# Patient Record
Sex: Female | Born: 1965 | Race: White | Hispanic: No | Smoking: Never smoker
Health system: Southern US, Community
[De-identification: ages and names within clinical notes are randomized; demographics above are authoritative.]

## PROBLEM LIST (undated history)

## (undated) DIAGNOSIS — G629 Polyneuropathy, unspecified: Secondary | ICD-10-CM

## (undated) DIAGNOSIS — M5126 Other intervertebral disc displacement, lumbar region: Secondary | ICD-10-CM

## (undated) DIAGNOSIS — R011 Cardiac murmur, unspecified: Secondary | ICD-10-CM

## (undated) DIAGNOSIS — M4800 Spinal stenosis, site unspecified: Secondary | ICD-10-CM

## (undated) DIAGNOSIS — D510 Vitamin B12 deficiency anemia due to intrinsic factor deficiency: Secondary | ICD-10-CM

## (undated) DIAGNOSIS — F32A Depression, unspecified: Secondary | ICD-10-CM

## (undated) DIAGNOSIS — G43909 Migraine, unspecified, not intractable, without status migrainosus: Secondary | ICD-10-CM

## (undated) DIAGNOSIS — M659 Unspecified synovitis and tenosynovitis, unspecified site: Secondary | ICD-10-CM

## (undated) DIAGNOSIS — G5600 Carpal tunnel syndrome, unspecified upper limb: Secondary | ICD-10-CM

## (undated) DIAGNOSIS — K219 Gastro-esophageal reflux disease without esophagitis: Secondary | ICD-10-CM

## (undated) DIAGNOSIS — M069 Rheumatoid arthritis, unspecified: Secondary | ICD-10-CM

## (undated) DIAGNOSIS — H469 Unspecified optic neuritis: Secondary | ICD-10-CM

## (undated) DIAGNOSIS — E7800 Pure hypercholesterolemia, unspecified: Secondary | ICD-10-CM

## (undated) DIAGNOSIS — G35D Multiple sclerosis, unspecified: Secondary | ICD-10-CM

## (undated) DIAGNOSIS — M5030 Other cervical disc degeneration, unspecified cervical region: Secondary | ICD-10-CM

## (undated) DIAGNOSIS — M359 Systemic involvement of connective tissue, unspecified: Secondary | ICD-10-CM

## (undated) DIAGNOSIS — F329 Major depressive disorder, single episode, unspecified: Secondary | ICD-10-CM

## (undated) DIAGNOSIS — G35 Multiple sclerosis: Secondary | ICD-10-CM

## (undated) HISTORY — DX: Multiple sclerosis: G35

## (undated) HISTORY — DX: Spinal stenosis, site unspecified: M48.00

## (undated) HISTORY — DX: Cardiac murmur, unspecified: R01.1

## (undated) HISTORY — DX: Migraine, unspecified, not intractable, without status migrainosus: G43.909

## (undated) HISTORY — PX: TONSILLECTOMY: SUR1361

## (undated) HISTORY — DX: Gastro-esophageal reflux disease without esophagitis: K21.9

## (undated) HISTORY — PX: ADENOIDECTOMY: SUR15

## (undated) HISTORY — DX: Depression, unspecified: F32.A

## (undated) HISTORY — DX: Vitamin B12 deficiency anemia due to intrinsic factor deficiency: D51.0

## (undated) HISTORY — DX: Carpal tunnel syndrome, unspecified upper limb: G56.00

## (undated) HISTORY — DX: Other intervertebral disc displacement, lumbar region: M51.26

## (undated) HISTORY — DX: Pure hypercholesterolemia, unspecified: E78.00

## (undated) HISTORY — DX: Other cervical disc degeneration, unspecified cervical region: M50.30

## (undated) HISTORY — DX: Rheumatoid arthritis, unspecified: M06.9

## (undated) HISTORY — DX: Polyneuropathy, unspecified: G62.9

## (undated) HISTORY — DX: Multiple sclerosis, unspecified: G35.D

## (undated) HISTORY — PX: TONSILECTOMY, ADENOIDECTOMY, BILATERAL MYRINGOTOMY AND TUBES: SHX2538

## (undated) HISTORY — DX: Major depressive disorder, single episode, unspecified: F32.9

## (undated) HISTORY — DX: Unspecified synovitis and tenosynovitis, unspecified site: M65.90

## (undated) HISTORY — DX: Unspecified optic neuritis: H46.9

## (undated) HISTORY — DX: Synovitis and tenosynovitis, unspecified: M65.9

## (undated) HISTORY — PX: NASAL FRACTURE SURGERY: SHX718

---
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
## 1987-03-01 HISTORY — PX: CHOLECYSTECTOMY: SHX55

## 1998-01-19 ENCOUNTER — Other Ambulatory Visit: Admission: RE | Admit: 1998-01-19 | Discharge: 1998-01-19 | Payer: Self-pay | Admitting: Obstetrics & Gynecology

## 1999-01-19 ENCOUNTER — Other Ambulatory Visit: Admission: RE | Admit: 1999-01-19 | Discharge: 1999-01-19 | Payer: Self-pay | Admitting: Obstetrics & Gynecology

## 2000-01-09 ENCOUNTER — Ambulatory Visit (HOSPITAL_COMMUNITY): Admission: RE | Admit: 2000-01-09 | Discharge: 2000-01-09 | Payer: Self-pay | Admitting: Neurosurgery

## 2000-01-09 ENCOUNTER — Encounter: Payer: Self-pay | Admitting: Neurosurgery

## 2000-02-01 ENCOUNTER — Other Ambulatory Visit: Admission: RE | Admit: 2000-02-01 | Discharge: 2000-02-01 | Payer: Self-pay | Admitting: Obstetrics & Gynecology

## 2000-03-17 ENCOUNTER — Encounter: Payer: Self-pay | Admitting: Neurosurgery

## 2000-03-17 ENCOUNTER — Ambulatory Visit (HOSPITAL_COMMUNITY): Admission: RE | Admit: 2000-03-17 | Discharge: 2000-03-17 | Payer: Self-pay | Admitting: Neurosurgery

## 2000-03-31 ENCOUNTER — Encounter: Payer: Self-pay | Admitting: Neurosurgery

## 2000-03-31 ENCOUNTER — Ambulatory Visit (HOSPITAL_COMMUNITY): Admission: RE | Admit: 2000-03-31 | Discharge: 2000-03-31 | Payer: Self-pay | Admitting: Neurosurgery

## 2000-04-14 ENCOUNTER — Ambulatory Visit (HOSPITAL_COMMUNITY): Admission: RE | Admit: 2000-04-14 | Discharge: 2000-04-14 | Payer: Self-pay | Admitting: Neurosurgery

## 2000-04-14 ENCOUNTER — Encounter: Payer: Self-pay | Admitting: Neurosurgery

## 2000-06-05 ENCOUNTER — Ambulatory Visit (HOSPITAL_COMMUNITY): Admission: RE | Admit: 2000-06-05 | Discharge: 2000-06-05 | Payer: Self-pay | Admitting: Obstetrics & Gynecology

## 2000-06-05 ENCOUNTER — Encounter: Payer: Self-pay | Admitting: Obstetrics & Gynecology

## 2000-06-29 ENCOUNTER — Encounter (HOSPITAL_COMMUNITY): Admission: RE | Admit: 2000-06-29 | Discharge: 2000-07-29 | Payer: Self-pay | Admitting: Rheumatology

## 2000-10-14 ENCOUNTER — Encounter: Payer: Self-pay | Admitting: Neurosurgery

## 2000-10-14 ENCOUNTER — Ambulatory Visit (HOSPITAL_COMMUNITY): Admission: RE | Admit: 2000-10-14 | Discharge: 2000-10-14 | Payer: Self-pay | Admitting: Neurosurgery

## 2001-09-10 ENCOUNTER — Other Ambulatory Visit: Admission: RE | Admit: 2001-09-10 | Discharge: 2001-09-10 | Payer: Self-pay | Admitting: Obstetrics & Gynecology

## 2002-01-11 ENCOUNTER — Ambulatory Visit (HOSPITAL_COMMUNITY): Admission: RE | Admit: 2002-01-11 | Discharge: 2002-01-11 | Payer: Self-pay | Admitting: Orthopedic Surgery

## 2002-01-11 ENCOUNTER — Encounter: Payer: Self-pay | Admitting: Orthopedic Surgery

## 2002-05-20 ENCOUNTER — Encounter: Payer: Self-pay | Admitting: Rheumatology

## 2002-05-20 ENCOUNTER — Ambulatory Visit (HOSPITAL_COMMUNITY): Admission: RE | Admit: 2002-05-20 | Discharge: 2002-05-20 | Payer: Self-pay | Admitting: Rheumatology

## 2002-08-06 ENCOUNTER — Ambulatory Visit (HOSPITAL_BASED_OUTPATIENT_CLINIC_OR_DEPARTMENT_OTHER): Admission: RE | Admit: 2002-08-06 | Discharge: 2002-08-06 | Payer: Self-pay | Admitting: Orthopedic Surgery

## 2002-08-06 ENCOUNTER — Encounter (INDEPENDENT_AMBULATORY_CARE_PROVIDER_SITE_OTHER): Payer: Self-pay | Admitting: *Deleted

## 2002-10-11 ENCOUNTER — Ambulatory Visit (HOSPITAL_COMMUNITY): Admission: RE | Admit: 2002-10-11 | Discharge: 2002-10-11 | Payer: Self-pay | Admitting: Family Medicine

## 2002-10-11 ENCOUNTER — Encounter: Payer: Self-pay | Admitting: Family Medicine

## 2002-10-18 ENCOUNTER — Ambulatory Visit (HOSPITAL_COMMUNITY): Admission: RE | Admit: 2002-10-18 | Discharge: 2002-10-18 | Payer: Self-pay | Admitting: Neurology

## 2003-01-08 ENCOUNTER — Other Ambulatory Visit: Admission: RE | Admit: 2003-01-08 | Discharge: 2003-01-08 | Payer: Self-pay | Admitting: Obstetrics & Gynecology

## 2003-06-02 ENCOUNTER — Ambulatory Visit (HOSPITAL_COMMUNITY): Admission: RE | Admit: 2003-06-02 | Discharge: 2003-06-02 | Payer: Self-pay | Admitting: *Deleted

## 2003-10-31 ENCOUNTER — Ambulatory Visit (HOSPITAL_COMMUNITY): Admission: RE | Admit: 2003-10-31 | Discharge: 2003-10-31 | Payer: Self-pay | Admitting: Otolaryngology

## 2004-03-10 ENCOUNTER — Other Ambulatory Visit: Admission: RE | Admit: 2004-03-10 | Discharge: 2004-03-10 | Payer: Self-pay | Admitting: Obstetrics & Gynecology

## 2004-03-17 ENCOUNTER — Inpatient Hospital Stay (HOSPITAL_COMMUNITY): Admission: AD | Admit: 2004-03-17 | Discharge: 2004-03-17 | Payer: Self-pay | Admitting: Obstetrics and Gynecology

## 2004-05-10 ENCOUNTER — Ambulatory Visit (HOSPITAL_COMMUNITY): Admission: RE | Admit: 2004-05-10 | Discharge: 2004-05-10 | Payer: Self-pay | Admitting: Psychiatry

## 2004-05-17 ENCOUNTER — Ambulatory Visit (HOSPITAL_COMMUNITY): Admission: RE | Admit: 2004-05-17 | Discharge: 2004-05-17 | Payer: Self-pay | Admitting: Obstetrics and Gynecology

## 2004-06-07 ENCOUNTER — Inpatient Hospital Stay (HOSPITAL_COMMUNITY): Admission: RE | Admit: 2004-06-07 | Discharge: 2004-06-09 | Payer: Self-pay | Admitting: Obstetrics and Gynecology

## 2004-06-07 HISTORY — PX: TOTAL ABDOMINAL HYSTERECTOMY: SHX209

## 2004-06-07 HISTORY — PX: APPENDECTOMY: SHX54

## 2005-01-25 ENCOUNTER — Ambulatory Visit: Payer: Self-pay | Admitting: Cardiology

## 2005-01-25 ENCOUNTER — Ambulatory Visit (HOSPITAL_COMMUNITY): Admission: RE | Admit: 2005-01-25 | Discharge: 2005-01-25 | Payer: Self-pay | Admitting: *Deleted

## 2006-06-13 ENCOUNTER — Ambulatory Visit (HOSPITAL_COMMUNITY): Admission: RE | Admit: 2006-06-13 | Discharge: 2006-06-13 | Payer: Self-pay | Admitting: *Deleted

## 2006-06-13 HISTORY — PX: CARDIAC CATHETERIZATION: SHX172

## 2006-08-03 ENCOUNTER — Ambulatory Visit (HOSPITAL_COMMUNITY): Admission: RE | Admit: 2006-08-03 | Discharge: 2006-08-03 | Payer: Self-pay | Admitting: Psychiatry

## 2006-11-03 ENCOUNTER — Ambulatory Visit (HOSPITAL_COMMUNITY): Admission: RE | Admit: 2006-11-03 | Discharge: 2006-11-03 | Payer: Self-pay | Admitting: Orthopedic Surgery

## 2007-02-26 ENCOUNTER — Ambulatory Visit (HOSPITAL_COMMUNITY): Admission: RE | Admit: 2007-02-26 | Discharge: 2007-02-26 | Payer: Self-pay | Admitting: Neurology

## 2007-02-26 ENCOUNTER — Encounter: Payer: Self-pay | Admitting: Neurology

## 2009-02-26 ENCOUNTER — Ambulatory Visit (HOSPITAL_COMMUNITY): Admission: RE | Admit: 2009-02-26 | Discharge: 2009-02-26 | Payer: Self-pay | Admitting: Family Medicine

## 2009-05-25 ENCOUNTER — Encounter: Admission: RE | Admit: 2009-05-25 | Discharge: 2009-05-25 | Payer: Self-pay | Admitting: Neurosurgery

## 2009-12-02 ENCOUNTER — Ambulatory Visit (HOSPITAL_COMMUNITY): Admission: RE | Admit: 2009-12-02 | Discharge: 2009-12-02 | Payer: Self-pay | Admitting: Family Medicine

## 2010-03-21 ENCOUNTER — Encounter: Payer: Self-pay | Admitting: Family Medicine

## 2010-07-16 NOTE — Op Note (Signed)
NAME:  WANNA, Nancy Byrd                          ACCOUNT NO.:  [id number]   MEDICAL RECORD NO.:  [id number]                   PATIENT TYPE:  OUT   LOCATION:  RAD                                  FACILITY:  [facility]   PHYSICIAN:  Katy Fitch. Naaman Plummer., M.D.          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   OPERATION PERFORMED:  1. Radical tenosynovectomy of right index flexor digitorum profundus and     superficialis tendons with central resection of swollen segment of flexor     digitorum profundus tendon causing triggering at A2 pulley and partial     release of A1 pulley to facilitate proximal tenosynovectomy.  2. Radical tenosynovectomy of right long finger flexor digitorum     superficialis and profundus tendons from A5 to A1 pulleys with     preservation of annular pulleys, followed by partial resection of flexor     digitorum profundus due to severe swelling due to nodular tenosynovitis.  3. Right carpal tunnel release and tenosynovectomy of ulnar bursa.   SURGEON:  Katy Fitch. Sypher, M.D.   ASSISTANT:  Jonni Sanger, P.A.   ANESTHESIA:  Axillary block.   SUPERVISING ANESTHESIOLOGIST:  Dr. Katrinka Blazing.   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  She has been managed by Dr. Kellie Simmering and Dr. Corliss Skains from a rheumatologic  standpoint with medical management of her illness.  She was referred  approximately one year ago for evaluation and management of nodular flexor  tenosynovitis affecting her hands bilaterally.  She had developed severe  pain and inability to flex her index and long fingers.   In the fall of 2003 she was referred for an MRI of her right and left hands  in an effort to determine whether or not she had tendon ruptures.  She was  noted to have severe infiltrative nodular tenosynovitis involving profundus  and superficialis tendons, primarily the index and long fingers as well as  some involvement in the carpal canal.  She had numbness in her right and  left hand consistent with carpal tunnel syndrome.  She was referred for  aggressive medical management of her disease and after working with  her  rheumatologist, utilizing a combination of methotrexate and Enbrel, she has  had some improvement in her tenosynovitis.  Due to failure to regain active  flexion of her index and long fingers, however, as well as persistence of  her episodic numbness, she is brought to the operating room at this time  anticipating radical tenosynovectomy and carpal tunnel release.   After informed consent she is brought to the operating room at this time.   DESCRIPTION OF PROCEDURE:  Nancy Byrd was brought to the operating room  and placed in supine position on the operating table.  The right arm was  prepped with Hibiclens and alcohol and draped with stockinet and impervious  arthroscopy drapes.  Following exsanguination of the right arm with an  Esmarch bandage, an arterial tourniquet on the proximal brachium was  inflated to .   The procedure commenced with planning of Brunner's zigzag incisions exposing  the flexor sheaths from A5 to A1 for the index and long finger.  The carpal  tunnel  incision was planned to facilitate release of the transverse carpal  ligament and tenosynovectomy of the flexors in the ulnar bursa.  The skin  incisions were taken sharply and subcutaneous veins electrocauterized.  There was noted to be nodular tenosynovitis extruding from the flexor  sheaths of the index and long fingers at the level of the A3 pulley, C1 and  C2 pulleys and proximal at the A1 pulleys.  Meticulous excision of the  cruciate pulleys was accomplished with preservation of the annular pulleys  of the index and long fingers.  Flexor digitorum profundus and superficialis  tendons were delivered and meticulously stripped of all tenosynovium.  Areas  of nodular infiltration were removed with rongeurs and in both fingers, the  profundus tendon was thinned by a central resection of a fusiform portion of  the involved tendon.  Care was taken to carefully examine the dorsal surface  of the flexor retinaculum and canal particularly deep to the A2 pulleys in  both fingers.  After completion of the tenosynovectomy there was still  triggering noted in the index finger at the level of the A1 pulley.  After  thorough debridement of all tenosynovium with persistent triggering, a  portion of the A1 pulley was released to allow smooth passage of the  tendons.   The carpal tunnel was then exposed through a longitudinal incision in line  of the long-ring interspace.  Subcutaneous tissues were carefully divided  revealing the palmar fascia.  This was split longitudinally to reveal the  contents of the carpal canal.  The superficialis and profundus tendons were  invested in a thickened tenosynovium. This was removed with scissors  dissection.   Traction was applied to the index and long finger profundus and  superficialis tendons to assure full passive motion through the flexor  sheath.  There was no impairment of motion of the long finger.  A very slight residual triggering of the index finger  persisted, therefore  the  profundus tendon was thinned deep to the A2 pulley relieving the triggering.  All wounds were inspected for bleeding points which were electrocauterized  with bipolar current followed by repair of the skin wounds with corner  sutures of 5-0 nylon and intradermal 3-0 Prolene.  There were no apparent  complications.   Nancy Byrd was awakened from sedation and transferred to recovery room with  stable vital signs.  She was placed in a voluminous hand dressing was dorsal  and palmar plaster sandwich  splints maintaining the hand in the SAFE  position.  She will return to our office in follow-up in six days to begin  an active range of motion exercise program.  For aftercare she was given prescriptions for Percocet 5 mg one or two  tablets by mouth every four to six hours as needed for pain, 30 tablets  without refill. Also Levaquin 500 mg one by mouth daily for five days as a  prophylactic antibiotic.                                                Katy Fitch Naaman Plummer., M.D.    RVS/MEDQ  D:  08/06/2002  T:  08/06/2002  Job:  [id number]   cc:   Pollyann Savoy, M.D.  [address]  [place], [place] [address]  Fax: [phone]   Aundra Dubin, M.D.

## 2010-07-16 NOTE — Op Note (Signed)
NAME:  Nancy Byrd, Nancy Byrd NO.:  [id number]   MEDICAL RECORD NO.:  [id number]          PATIENT TYPE:  AMB   LOCATION:  DAY                           FACILITY:  [facility]   PHYSICIAN:  Tilda Burrow, M.D. DATE OF BIRTH:  06/09/1965   DATE OF PROCEDURE:  06/07/2004  DATE OF DISCHARGE:                                 OPERATIVE REPORT   PREOPERATIVE DIAGNOSIS:  Cystic left ovarian mass.   POSTOPERATIVE DIAGNOSIS:  Cystic left ovarian mass; endometrioma, left  ovary;  pelvic endometriosis.   PROCEDURE:  Total abdominal hysterectomy with bilateral salpingo-  oophorectomy. Peritoneal biopsies of endometriosis. Appendectomy.  Panniculectomy.   SURGEON:  Dr. Emelda Fear. , Assistant: Paulino Rily.   ANESTHESIA:  General.   SPECIMENS:  Uterus, tubes, and ovaries, appendix and ellipse of skin and  underlying fatty tissue.   ESTIMATED BLOOD LOSS:  200 cc.   COMPLICATIONS:  None.   FINDINGS:  Lax pelvic support structures, endometriomas in both ovaries,  left distinctly larger than right, pelvis adhesions with endometrial  implants on the anterior bladder and posterior uterosacral ligaments. Normal  appearing bowel, small, omentum with no abnormalities noted.   DETAILS OF PROCEDURE:  The patient was taken to the operating room and  prepped and draped for vertical lower abdominal incision. Skin was entered  in the midline from the umbilicus to symphysis pubis with excision of an 8-  cm maximum width x 20 cm maximum length ellipse of skin and underlying fatty  tissue as per patient's specific request, having been marked preoperatively  and at preoperative procedures. Preoperative consultation. Peritoneal cavity  was entered carefully in the midline and pelvis inspected only. The  endometrial implants and endometriosis identified. The bowel was packed away  with Balfour retractor in place. Care was made to ensure that the blades do  not reach the pelvic vessels and no  retroperitoneal pressure was applied by  the blade tips. Attention was directed to the uterus which was grasped with  Lahey thyroid tenaculum and elevated. Pelvic washings were obtained. The  left ovary was found to have thin filmy adhesions from its posterior aspects  to the lateral and inferior pelvic side wall. This could be sharply  dissected free, leaving the ovary intact as well as the bowel and  retroperitoneum. Round ligaments were taken down, doubly ligated, clamped,  cut, and suture ligated, and bladder flap developed below the anterior  endometrial implants. Four small implants were excised off the surface of  the bladder. The bladder was taken down, and the peritoneum opened lateral  to the infundibulopelvic ligament adequately to identify the ureters on  either side. Infundibulopelvic ligaments were then clamped, cut, and suture  ligated. Broad ligament was skeletonized down to the uterine vessels. Curved  Heaney clamp was used to cross the uterine vessels with a Kelly clamp for  back bleeding. Then the uterine vessels were transected and ligated with 0  chromic, and open and lower cardinal ligaments serially taken down with  straight Heaney clamp, knife dissection, and 0 chromic suture ligature. We  march down each side of the  uterus until reaching the level of the cervix  where upon a stab incision could be made in the anterior cervical vaginal  fornix and the cervix amputated, cervix and uterine all removed in one  specimen. The cuff was closed in the midline, after Aldrich stitches were  placed in each lateral vaginal angle to adhere the cuff to the upper and  lower cardinal ligaments. The cuff was easily reapproximated with good  hemostasis. Uterosacral ligament support of the cuff was considered  satisfactory. A small triangle at the anterior vaginal cuff had been excised  to allow for optimal tissue edge reapproximation and to reduce the potential  for subsequent  cystocele.   Pelvic floor was irrigated, inspected, and confirmed and hemostatic.  Interrupted 2-0 chromic was used to reperitonealize over the cuff.   The Balfour retractor was removed. Laparotomy tapes were removed, and the  appendix inspected. It was easily identifiable anterior and was removed as  per preoperative surgical plan. The mesoappendix was separated into two  pedicles with Kelly clamps across them, then the appendiceal stump doubly  clamped with Kelly clamps, transected, and the appendiceal stump doubly  ligated with 2-0 chromic. Pelvis was again irrigated. Inspection of the  visible surface of the bowel showed no evidence of bleeding or any suspicion  of contamination. Anterior peritoneum was closed with 2-0 chromic.   Sponge and needle counts were correct.   Fascia was trimmed in the upper one half of the midline incision to improve  lower abdominal tone and pull together with continuous running 0 Maxon  suture with excellent tissue edge approximation. Interrupted 2-0 plain  sutures were used to pull the subcu fatty tissue together in the midline,  and staple closure of the skin completed the procedure. A flat JP drain in  the subcu space was allowed to exit through the inferior aspect of the  incision. Sponge and needle counts were again correct. The patient went to  recovery room in good condition. EBL 200 cc.      JVF/MEDQ  D:  06/07/2004  T:  06/07/2004  Job:  [id number]

## 2010-07-16 NOTE — Cardiovascular Report (Signed)
[name], Nancy Byrd NO.:  [id number]   MEDICAL RECORD NO.:  [id number]          PATIENT TYPE:  OIB   LOCATION:  2855                         FACILITY:  [facility]   PHYSICIAN:  Nanetta Batty, M.D.   DATE OF BIRTH:  02/06/1966   DATE OF PROCEDURE:  DATE OF DISCHARGE:                            CARDIAC CATHETERIZATION   PROCEDURES:  1. Left heart catheterization.  2. Coronary angiography.  3. Left ventriculogram.  4. Abdominal arteriogram.   ATTENDING PHYSICIAN:  Darlin Priestly, MD   COMPLICATIONS:  None.   INDICATIONS:  Ms. Savarino is a 45 year old female patient of Dr. Donzetta Sprung with a history of multiple sclerosis, pernicious anemia, reflux  disease, rheumatoid arthritis who recently complained of a rest and  exertional chest pain with sensation of crushing type of pressure and  some facial edema.  She was ultimately evaluated in the emergency room  and she was felt to have a normal EKG.  She is now referred for cardiac  catheterization without significant CAD.   After obtaining informed consent, patient was brought to the cardiac  cath lab.  Shaved, prepped and draped in the usual sterile fashion.  __________  modified Seldinger technique, #6 French arterial sheath in  the right femoral artery.  A 6 French diagnostic catheter was performed  and diagnostic angiography.   Left main is a large vessel showed no evidence of disease.   The LAD to large vessel __________  2 diagonal branches to the LAD has  no significant disease.   The first diagonal to the small vessel has no significant disease.   The second diagonal to the small vessel has significant disease.   Left circumflex to the large vessel which was dominant, gives rise to  two obtuse marginal branches as well as PD and post lateral branch.  AVB  circumflex has no significant disease.   The first OM is a large vessel which bifurcates in the mid segment with  no significant disease.   The second OM is a medium size vessel with no significant disease.   The PDA and  posterolateral branches are large vessels which originate  from the circumflex has no significant disease.   The right coronary artery is a small, nondominant vessel with  significant disease.   Left ventriculogram reveals an EF of 60%.   Abdominal aortogram has no evidence of coronary artery stenosis.   Hemodynamic system:  Arterial pressure of 140/91, LV systolic pressure  144/46. LVDEP Of 13.   CONCLUSION:  1. No significant coronary artery disease.  2. No evidence of systolic dysfunction.  3. No evidence renal artery stenosis.  4. Systemic hypertension.      Nanetta Batty, M.D.  Electronically Signed     JB/MEDQ  D:  06/13/2006  T:  06/13/2006  Job:  [id number]   cc:   Donzetta Sprung

## 2010-07-16 NOTE — Procedures (Signed)
Nancy Byrd, BROOKSHIRE NO.:  [id number]   MEDICAL RECORD NO.:  [id number]          PATIENT TYPE:  OUT   LOCATION:  RAD                           FACILITY:  [facility]   PHYSICIAN:  [place] Bing, M.D. [facility] OF BIRTH:  13-Nov-1965   DATE OF PROCEDURE:  01/25/2005  DATE OF DISCHARGE:                                  ECHOCARDIOGRAM   REFERRING:  Dr. Garner Nash and Dr. Dorethea Clan.   CLINICAL DATA:  A 45 year old woman with murmur.   M-MODE:  Aorta 2.5, left atrium 4.0, septum 1.1, posterior wall 1.1, LV  diastole 4.8, LV systole 3.2.   1.  Technically adequate echocardiographic study.  2.  Mild left atrial enlargement; normal right atrium and right ventricle.  3.  Normal trileaflet aortic valve.  4.  Delicate mitral valve; flat coaptation but no definite prolapse.  5.  Normal tricuspid and pulmonic valve; normal proximal pulmonary artery.      Trivial tricuspid regurgitation; normal estimated RV systolic pressure.  6.  Normal internal dimension, wall thickness, regional and global function      of the left ventricle.  7.  Normal IVC.  8.  Comparison with prior study of June 02, 2003:  No significant interval      change.      Matthews Bing, M.D. [facility]  Electronically Signed     RR/MEDQ  D:  01/25/2005  T:  01/26/2005  Job:  [id number]

## 2010-07-16 NOTE — Procedures (Signed)
NAME:  MYKIA, HOLTON NO.:  [id number]   MEDICAL RECORD NO.:  [id number]                  PATIENT TYPE:  PREC   LOCATION:                                       FACILITY:   PHYSICIAN:  Vida Roller, M.D.                DATE OF BIRTH:  [birth year]   DATE OF PROCEDURE:  06/02/2003  DATE OF DISCHARGE:                                    STRESS TEST   PROCEDURE:  Adenosine Cardiolite   INDICATIONS:  This patient is a 45 year old female with no known coronary  artery disease and atypical chest discomfort.  She has a recent diagnosis of  multiple sclerosis and is being treated with __________ which is an  interferon.  Since she has started taking this medication she has had this  chest discomfort.   CARDIAC RISK FACTORS:  No diabetes, no family history, no hyperlipidemia and  no tobacco abuse.   BASELINE DATA:  EKG reveals a sinus bradycardia at 56 beats/minute with  nonspecific ST abnormalities.  Blood pressure is 128/80.   STRESS DATA:  45 mg of adenosine was infused over a 4 minute protocol with  Cardiolite injected at 3 minutes.  The patient reported chest pressure which  resolved in recovery.  EKG reveals no arrhythmias and no ischemic changes.   Final images and results are pending MD review.     ________________________________________  ___________________________________________  Jae Dire, P.A. [facility]                      Vida Roller, M.D.   AB/MEDQ  D:  06/02/2003  T:  06/02/2003  Job:  [id number]

## 2010-07-16 NOTE — Op Note (Signed)
   NAME:  Nancy Byrd, Nancy Byrd NO.:  [id number]   MEDICAL RECORD NO.:  [id number]                   PATIENT TYPE:  OUT   LOCATION:  MDC                                  FACILITY:  [facility]   PHYSICIAN:  Marlan Palau, M.D.               DATE OF BIRTH:  1965/07/17   DATE OF PROCEDURE:  10/18/2002  DATE OF DISCHARGE:                                 OPERATIVE REPORT   PROCEDURE:  Lumbar puncture.   HISTORY:  This is a 45 year old patient who is reporting sensory alterations  in the left face, left arm, has documented white matter lesion in the left  parietal area.  This patient is being evaluated for possible demyelinating  disease.   A lumbar puncture was performed today with the patient in the fetal position  on the right side.  The low back was cleaned with alcohol due to  BETADINE/IODINE allergy.  The patient was given 2 mL of 1% Xylocaine as a  local anesthetic.  A 20-gauge spinal needle was inserted into the L3-4  interspace and approximately 18 mL of clear, colorless spinal fluid was  removed for testing.  Opening pressure was 160 mmH2O.  Tube #1 was sent for  VDRL, cryptococcal antigen, angiotensin-converting enzyme level.  Tube #2  was sent for oligoclonal banding, IgG/albumin ratio.  Tube #3 was sent for  cells, differential, protein, glucose.  Tube #4 was sent for Lyme antibody  panel.  Blood work was obtained for ANA, sedimentation rate, homocysteine  level, antiphospholipid antibody panel.  The patient tolerated the procedure  well.  No complications of the above procedure were noted.  The patient  white female with [facility] for results in one week.                                               Marlan Palau, M.D.    CKW/MEDQ  D:  10/18/2002  T:  10/19/2002  Job:  [id number]

## 2010-07-16 NOTE — H&P (Signed)
NAME:  Nancy Byrd, Nancy Byrd NO.:  [id number]   MEDICAL RECORD NO.:  [id number]          PATIENT TYPE:  AMB   LOCATION:  DAY                           FACILITY:  [facility]   PHYSICIAN:  Tilda Burrow, M.D. DATE OF BIRTH:  December 24, 1965   DATE OF ADMISSION:  06/07/2004  DATE OF DISCHARGE:  LH                                HISTORY & PHYSICAL   ADMISSION DIAGNOSIS:  Cystic left ovarian mass.   HISTORY OF PRESENT ILLNESS:  This 45 year old female is admitted for surgery  for surgical excision of a homogeneous cystic 7 cm mass in the left ovary,  which is to be removed by exploratory laparotomy.  Plans are for  hysterectomy with removal of uterus, tube and ovary with frozen section to  be performed.  Plans are for removal of the opposite ovary at the same time.  The case has been reviewed with De Blanch, M.D., GYN oncologist,  who supports proceeding with surgical removal here.  The ultrasound showed a  simple cystic-appearing ovary.  She has been followed by W. Varney Baas,  M.D., but requests surgery at [facility] for family and personal  and familiarity with [facility].  She is an employee at [employer].  She is aware of the likely benign nature of the mass, but is  aware that the possibility of malignancy cannot be absolutely ruled out  until tissue samples are fully analyzed.  Plans are to proceed with  laparotomy through a midline incision, frozen section at the time of surgery  and surgical procedure as indicated by moving forward.   PAST MEDICAL HISTORY:  Positive for multiple sclerosis.   PAST SURGICAL HISTORY:  Positive for:  1.  Tonsillectomy in 1970.  2.  Cholecystectomy in 1989.  3.  Rheumatoid arthritis surgery in 2004 at [facility].  4.  Left hand rheumatoid arthritis surgery in 1985.   ALLERGIES:  1.  AMOXICILLIN.  2.  IODINE.   MEDICATIONS:  1.  Rebif 44 mcg subcutaneously on Monday, Wednesday and  Friday.  2.  Topamax 100 mg p.o. b.i.d.  3.  Ambien 100 mg q.h.s.  4.  Neurontin 300 mg t.i.d.  5.  Prevacid 30 mg p.o. daily.  6.  Calcium plus vitamin D 1200 mg daily.  7.  Zomig 5 mg p.r.n.  8.  Oxybutynin chloride 5 mg b.i.d.  9.  Provigil 200 mg t.i.d.  10. Motrin 800 mg.  11. Tylenol 1000 mg taken prior to Rebif.   PHYSICAL EXAMINATION:  GENERAL APPEARANCE:  A slim Caucasian female.  HEIGHT:  5 feet 5 inches.  WEIGHT:  175 pounds.  HEENT:  Pupils equal, round and reactive.  NECK:  Supple.  Trachea.  CHEST:  Clear to auscultation.  ABDOMEN:  Nontender.  Slight lower abdominal laxity.  The midline incision  will involve excision of some redundant skin in the lower abdomen.  PELVIC:  External genitalia normal female.  Vaginal exam with normal cervix.  Normal Pap smear in the last year through Dr. Lacretia Nicks. [facility] office.  Uterus small,  mobile and nontender.  Left adnexal mass palpable behind the  uterus.  Ultrasound confirms absences of ascites.  CT scan shows no intra-  abdominal nodularity, ascites or suspicion of tumor.   PLAN:  Laparotomy, total abdominal hysterectomy and bilateral salpingo-  oophorectomy on June 07, 2004.      JVF/MEDQ  D:  06/03/2004  T:  06/03/2004  Job:  [id number]   cc:   [facility]  Fax: [phone]

## 2010-07-16 NOTE — Procedures (Signed)
NAME:  Nancy Byrd, Nancy Byrd                          ACCOUNT NO.:  [id number]   MEDICAL RECORD NO.:  [id number]                   PATIENT TYPE:  OUT   LOCATION:  RAD                                  FACILITY:  [facility]   PHYSICIAN:  Vida Roller, M.D.                DATE OF BIRTH:  [birth year]   DATE OF PROCEDURE:  06/02/2003  DATE OF DISCHARGE:                                  ECHOCARDIOGRAM   TAPE NUMBER:  LB - [id number].   TAPE COUNT:  [id number] - 3075.   INDICATIONS FOR PROCEDURE:  This is a 45 year old woman for assessment for a  patent foramen ovale.   TECHNICAL QUALITY:  Good.   M-MODE TRACINGS:  The aorta is 27 mm.   The left atrium is 32 mm.   The septum is 10 mm.   The posterior wall is 10 mm.   Left ventricular diastolic dimension is 44 mm.   Left ventricular systolic dimension is 31 mm.   2-D AND DOPPLER IMAGING:  The left ventricle is normal size with normal  systolic function. There are no wall motion abnormalities seen. Diastolic  function is normal as per the transmitral Doppler and pulmonary venous  Doppler tracings. There are no wall motion abnormalities seen. The estimated  ejection fraction is 55% to 60%.   The right ventricle is normal size with normal systolic function.   Both atria appear to be normal size. There is no atrial septal defect, both  by color flow and by agitated saline contrast.   The aortic valve is trileaflet, tri-commissural, with no evidence of  stenosis or regurgitation.   The mitral valve is morphologically unremarkable with trace insufficiency.  No stenosis is seen.   The tricuspid valve is morphologically unremarkable with no insufficiency or  stenosis.   The pulmonic valve is not well seen but appears to have trivial  insufficiency.   The inferior vena cava is normal size.   The pericardial structures are normal.   The ascending aorta is not well seen.      ___________________________________________                 Vida Roller, M.D.   JH/MEDQ  D:  06/02/2003  T:  06/03/2003  Job:  [id number]

## 2010-07-16 NOTE — Discharge Summary (Signed)
NAME:  Nancy Byrd, Nancy Byrd NO.:  [id number]   MEDICAL RECORD NO.:  [id number]          PATIENT TYPE:  INP   LOCATION:  A401                          FACILITY:  [facility]   PHYSICIAN:  Tilda Burrow, M.D. DATE OF BIRTH:  1965/12/15   DATE OF ADMISSION:  06/07/2004  DATE OF DISCHARGE:  LH                                 DISCHARGE SUMMARY   ADMITTING DIAGNOSIS:  Cystic left ovarian mass.   POSTOPERATIVE DIAGNOSES:  Left ovarian endometrioma, extensive pelvic  endometriosis with adhesions.   PROCEDURE:  Laparotomy total abdominal hysterectomy, bilateral salpingo-  oophorectomy, partial panniculectomy and incidental appendectomy April 09, 2004.   HOSPITAL SUMMARY:  This 45 year old female was admitted for exploratory  laparotomy due to homogeneous cystic left mass with plans for removal of  both tubes and ovaries.   PAST MEDICAL HISTORY:  Positive for multiple sclerosis.   HOSPITAL COURSE:  The patient was admitted, underwent exploratory laparotomy  through midline vertical incision with excision of some the adjacent skin  and subcutaneous fatty tissues. She had extensive endometriosis identified.  There were multiple endometrial implants on the bladder flap as well as a  cul-de-sac, which required individual resection as well as the removal of  the uterus, tubes and ovaries bilaterally. Appendectomy was similarly  performed without difficulty.   Postoperative course was notable for temperature to 101 on post op day 1  with elevated white count. Hemoglobin was 13.3, hematocrit 39 on admission.  She had post op hemoglobin 10.8, hematocrit 31.2, white count 12,900. She  was placed on IV Levaquin and defervesced overnight. It is notable that her  activity level was quite low the first 24 hours. This is consistent with  multiple sclerosis patients. The second day, she felt dramatically improved  and was stable for discharge home with active bowels and clean incision.  The  wound culture and gram stained from the flat Jackson-Pratt drain was  negative for organisms. She will be discharged on the following medications:   1.  Levaquin 500 mg p.o. q.d. x5 days.  2.  Vicodin 5/500 30 tablets 1 to 2 q. 4 hours p.r.n. pain.  3.  Estratest 1 p.o. q. day.   Additional medications previously taken are as follows:  1.  Rebif 44 mcg subcu Monday, Wednesday, Friday.  2.  Topamax 100 mg p.o. b.i.d.  3.  Ambien 100 mg q.h.s.  4.  Neurontin 300 mg t.i.d.  5.  Prevacid 30 mg p.o. daily.  6.  Calcium plus vitamin E 1200 mg daily.  7.  Zomig 5 mg p.r.n.  8.  Oxybutynin and chloride 5 mg b.i.d.  9.  Provigil 200 mg t.i.d.  10. Motrin 800 mg before Rebif therapy.  11. Tylenol 1000 prior to Rebif therapy.   Followup will be in 5 days for incision check.      JVF/MEDQ  D:  06/09/2004  T:  06/09/2004  Job:  [id number]

## 2011-10-26 ENCOUNTER — Other Ambulatory Visit (HOSPITAL_COMMUNITY): Payer: Self-pay | Admitting: Family Medicine

## 2011-10-26 DIAGNOSIS — Z139 Encounter for screening, unspecified: Secondary | ICD-10-CM

## 2011-11-01 ENCOUNTER — Ambulatory Visit (HOSPITAL_COMMUNITY)
Admission: RE | Admit: 2011-11-01 | Discharge: 2011-11-01 | Disposition: A | Discharge status: Home / Self Care | Payer: Medicare Other | Source: Ambulatory Visit | Attending: Family Medicine | Admitting: Family Medicine

## 2011-11-01 DIAGNOSIS — Z1231 Encounter for screening mammogram for malignant neoplasm of breast: Secondary | ICD-10-CM | POA: Insufficient documentation

## 2011-11-01 DIAGNOSIS — Z139 Encounter for screening, unspecified: Secondary | ICD-10-CM

## 2011-12-01 ENCOUNTER — Other Ambulatory Visit (HOSPITAL_COMMUNITY): Payer: Self-pay | Admitting: Family Medicine

## 2011-12-01 ENCOUNTER — Ambulatory Visit (HOSPITAL_COMMUNITY)
Admission: RE | Admit: 2011-12-01 | Discharge: 2011-12-01 | Disposition: A | Discharge status: Home / Self Care | Payer: Medicare Other | Source: Ambulatory Visit | Attending: Family Medicine | Admitting: Family Medicine

## 2011-12-01 ENCOUNTER — Encounter (HOSPITAL_COMMUNITY): Payer: Self-pay

## 2011-12-01 DIAGNOSIS — R0602 Shortness of breath: Secondary | ICD-10-CM

## 2011-12-01 HISTORY — DX: Systemic involvement of connective tissue, unspecified: M35.9

## 2011-12-01 MED ORDER — TECHNETIUM TO 99M ALBUMIN AGGREGATED
3.0000 | Freq: Once | INTRAVENOUS | Status: AC | PRN
  Administered 2011-12-01: 3.3 via INTRAVENOUS

## 2012-11-02 ENCOUNTER — Other Ambulatory Visit (HOSPITAL_COMMUNITY): Payer: Self-pay | Admitting: Family Medicine

## 2012-11-02 DIAGNOSIS — Z139 Encounter for screening, unspecified: Secondary | ICD-10-CM

## 2012-11-13 ENCOUNTER — Ambulatory Visit (HOSPITAL_COMMUNITY): Payer: Medicare Other

## 2012-11-13 ENCOUNTER — Other Ambulatory Visit (HOSPITAL_COMMUNITY): Payer: Self-pay | Admitting: Family Medicine

## 2012-11-13 DIAGNOSIS — Z139 Encounter for screening, unspecified: Secondary | ICD-10-CM

## 2012-12-13 ENCOUNTER — Ambulatory Visit (HOSPITAL_COMMUNITY)
Admission: RE | Admit: 2012-12-13 | Discharge: 2012-12-13 | Disposition: A | Discharge status: Home / Self Care | Payer: Medicare Other | Source: Ambulatory Visit | Attending: Family Medicine | Admitting: Family Medicine

## 2012-12-13 DIAGNOSIS — Z1231 Encounter for screening mammogram for malignant neoplasm of breast: Secondary | ICD-10-CM | POA: Insufficient documentation

## 2012-12-13 DIAGNOSIS — Z139 Encounter for screening, unspecified: Secondary | ICD-10-CM

## 2013-03-07 ENCOUNTER — Other Ambulatory Visit (HOSPITAL_COMMUNITY): Payer: Self-pay | Admitting: Orthopaedic Surgery

## 2013-03-07 DIAGNOSIS — Q762 Congenital spondylolisthesis: Secondary | ICD-10-CM

## 2013-03-11 ENCOUNTER — Ambulatory Visit (HOSPITAL_COMMUNITY)
Admission: RE | Admit: 2013-03-11 | Discharge: 2013-03-11 | Disposition: A | Discharge status: Home / Self Care | Payer: Medicare HMO | Source: Ambulatory Visit | Attending: Orthopaedic Surgery | Admitting: Orthopaedic Surgery

## 2013-03-11 ENCOUNTER — Ambulatory Visit (HOSPITAL_COMMUNITY): Payer: Medicare HMO

## 2013-03-11 ENCOUNTER — Other Ambulatory Visit (HOSPITAL_COMMUNITY): Payer: Self-pay | Admitting: Orthopaedic Surgery

## 2013-03-11 DIAGNOSIS — M51379 Other intervertebral disc degeneration, lumbosacral region without mention of lumbar back pain or lower extremity pain: Secondary | ICD-10-CM | POA: Insufficient documentation

## 2013-03-11 DIAGNOSIS — M5124 Other intervertebral disc displacement, thoracic region: Secondary | ICD-10-CM | POA: Insufficient documentation

## 2013-03-11 DIAGNOSIS — M47817 Spondylosis without myelopathy or radiculopathy, lumbosacral region: Secondary | ICD-10-CM | POA: Insufficient documentation

## 2013-03-11 DIAGNOSIS — M546 Pain in thoracic spine: Secondary | ICD-10-CM | POA: Insufficient documentation

## 2013-03-11 DIAGNOSIS — M545 Low back pain, unspecified: Secondary | ICD-10-CM | POA: Insufficient documentation

## 2013-03-11 DIAGNOSIS — M5137 Other intervertebral disc degeneration, lumbosacral region: Secondary | ICD-10-CM | POA: Insufficient documentation

## 2013-03-11 DIAGNOSIS — Q762 Congenital spondylolisthesis: Secondary | ICD-10-CM

## 2013-03-11 DIAGNOSIS — M5126 Other intervertebral disc displacement, lumbar region: Secondary | ICD-10-CM | POA: Insufficient documentation

## 2013-03-12 ENCOUNTER — Other Ambulatory Visit (HOSPITAL_COMMUNITY): Payer: Medicare Other

## 2013-12-02 ENCOUNTER — Other Ambulatory Visit (HOSPITAL_COMMUNITY): Payer: Self-pay | Admitting: Family Medicine

## 2013-12-02 DIAGNOSIS — Z1231 Encounter for screening mammogram for malignant neoplasm of breast: Secondary | ICD-10-CM

## 2013-12-10 ENCOUNTER — Telehealth: Payer: Self-pay | Admitting: Internal Medicine

## 2013-12-10 NOTE — Telephone Encounter (Signed)
Patient called today wanting to make an OV with RMR as a new patient. I explained to her that RMR prefers all his new patients to see the NP or PA at first visit, but I would assign him as her GI doctor. She stated several times that she did not want to see anyone that she may know from the hospital or has worked with in the past at [facility].  She said that she used to work with Deberah Castle and refuses to go to [employer] office for that reason. She then asked if LSL ever worked at [facility]. 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  She agreed to see LSL in next available and then stated again that she does not want anyone she knows knowing her health problems. I assured her that no one here will release any medical information on her without her permission and she is interested in getting something on the computer to block people from looking without going through a security screen. Several of [place] have worked at [facility] over the years and she may want to consider going to [place] if she has worries about seeing a former [employer] fast streamer. Please advise what we can do to make her feel  comfortable with her OV.

## 2013-12-10 NOTE — Telephone Encounter (Signed)
Agree with above. She has been made aware that staff members in our office have previously worked at [facility]. There is nothing we can change about that. It should be stressed to patient that her medical record/information will be kept confidential as with all of our patients. She can place HIPPA restrictions within confines of the policy. I'm not sure who places the security screen blocks but that can be done.   Camille, do we know who adds the "glass break" security screens.

## 2013-12-11 NOTE — Telephone Encounter (Signed)
I spoke with the patient and she just didn't want to see a provider that she use to work with.  She is okay with seeing Magda Paganini.  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

## 2013-12-16 ENCOUNTER — Ambulatory Visit (HOSPITAL_COMMUNITY)
Admission: RE | Admit: 2013-12-16 | Discharge: 2013-12-16 | Disposition: A | Discharge status: Home / Self Care | Payer: Medicare HMO | Source: Ambulatory Visit | Attending: Family Medicine | Admitting: Family Medicine

## 2013-12-16 DIAGNOSIS — Z1231 Encounter for screening mammogram for malignant neoplasm of breast: Secondary | ICD-10-CM | POA: Insufficient documentation

## 2014-01-14 ENCOUNTER — Ambulatory Visit: Payer: Medicare HMO | Admitting: Gastroenterology

## 2014-02-18 ENCOUNTER — Ambulatory Visit: Payer: Medicare HMO | Admitting: Nurse Practitioner

## 2014-03-24 ENCOUNTER — Ambulatory Visit: Payer: Medicare HMO | Admitting: Nurse Practitioner

## 2014-04-09 ENCOUNTER — Other Ambulatory Visit: Payer: Self-pay

## 2014-04-09 ENCOUNTER — Ambulatory Visit (INDEPENDENT_AMBULATORY_CARE_PROVIDER_SITE_OTHER): Payer: Medicare HMO | Admitting: Nurse Practitioner

## 2014-04-09 ENCOUNTER — Encounter: Payer: Self-pay | Admitting: Nurse Practitioner

## 2014-04-09 VITALS — BP 133/86 | HR 72 | Temp 97.2°F | Ht 62.0 in | Wt 171.4 lb

## 2014-04-09 DIAGNOSIS — K5900 Constipation, unspecified: Secondary | ICD-10-CM

## 2014-04-09 DIAGNOSIS — K625 Hemorrhage of anus and rectum: Secondary | ICD-10-CM | POA: Insufficient documentation

## 2014-04-09 MED ORDER — PEG 3350-KCL-NA BICARB-NACL 420 G PO SOLR: 4000.0000 mL | Freq: Once | ORAL | Status: DC

## 2014-04-09 NOTE — Progress Notes (Signed)
Primary Care Physician:  Gar Ponto, MD Primary Gastroenterologist:  Dr. Gala Romney  Chief Complaint  Patient presents with  . Rectal Bleeding    HPI:   49 year old female presents c/o rectal bleeding. Thinks she may have a rectocele or IBS. Rectal bleeding occurred about 2 months ago for 2-3 episodes which was bright red. Blood was in her underwear/clothes. Described as a little. Had hemorrhoids when pregnant as well as a rectocele but no recurrence of hemorrhoid problems since then. Has had a complete hysterectomy. Has aboutseveral bowel movement a day which are typically variable in consistency from 1-4 on the bristol stool scale. Does not take any stool softeners or laxatives. Denies dyspepsia, fever, chills, abdominal pain, unintentional weight loss, change in appetite. Has some musculoskeletal pain in her epigastric area after injuring/bruising herself on farm equipment. Continues to occur but rarely and only with abdominal flexion. Denies any other upper or lower GI symptoms.   Past Medical History  Diagnosis Date  . Cancer   . Collagen vascular disease     No past surgical history on file.  Current Outpatient Prescriptions  Medication Sig Dispense Refill  . amphetamine-dextroamphetamine (ADDERALL XR) 30 MG 24 hr capsule Take 30 mg by mouth 2 (two) times daily.    . Ascorbic Acid (VITAMIN C) 1000 MG tablet Take 1,000 mg by mouth daily.    Marland Kitchen aspirin 81 MG tablet Take 81 mg by mouth daily.    . Calcium Carbonate-Vitamin D (CALCIUM + D PO) Take 1,200 mg by mouth daily.    . Coenzyme Q10 (CO Q 10) 10 MG CAPS Take 10 mg by mouth daily.    . Cyanocobalamin (VITAMIN B-12 IJ) Inject as directed every 30 (thirty) days.    Marland Kitchen EPINEPHrine 0.3 mg/0.3 mL IJ SOAJ injection Inject 0.3 mg into the muscle once.    . Flax OIL Take 1,000 mg by mouth daily.    . furosemide (LASIX) 20 MG tablet Take 20 mg by mouth as needed.    Marland Kitchen omeprazole (PRILOSEC) 20 MG capsule Take 20 mg by mouth daily.    Marland Kitchen  oxybutynin (DITROPAN) 5 MG tablet Take 5 mg by mouth 2 (two) times daily.    Marland Kitchen rOPINIRole (REQUIP) 0.25 MG tablet Take 0.25 mg by mouth as needed.    Marland Kitchen SOYA LECITHIN PO Take 1,200 mg by mouth daily.    Marland Kitchen topiramate (TOPAMAX) 200 MG tablet Take 200 mg by mouth 2 (two) times daily.    Marland Kitchen UNABLE TO FIND Narcolepsy 40 mg po BID    . Venlafaxine HCl (EFFEXOR PO) Take 450 mg by mouth daily.    Marland Kitchen VITAMIN E PO Take 800 Units by mouth daily.    Marland Kitchen zolmitriptan (ZOMIG) 5 MG tablet Take 5 mg by mouth as needed for migraine.     No current facility-administered medications for this visit.    Allergies as of 04/09/2014 - Review Complete 04/09/2014  Allergen Reaction Noted  . Betadine [povidone iodine]  12/01/2011  . Iohexol  05/17/2004  . Shellfish allergy  12/01/2011    No family history on file.  History   Social History  . Marital Status: Widowed    Spouse Name: N/A  . Number of Children: N/A  . Years of Education: N/A   Occupational History  . Not on file.   Social History Main Topics  . Smoking status: Never Smoker   . Smokeless tobacco: Not on file  . Alcohol Use: No  .  Drug Use: No  . Sexual Activity: Not on file   Other Topics Concern  . Not on file   Social History Narrative    Review of Systems: Gen: Denies any fever, chills, fatigue, weight loss, lack of appetite.  CV: Denies chest pain, heart palpitations, peripheral edema, syncope.  Resp: Denies shortness of breath at rest or with exertion. Denies wheezing. Denies worsening shortness of breath while laying flat. GI: See HPI. Denies dysphagia or odynophagia. Denies jaundice, hematemesis, fecal incontinence. MS: Denies joint pain, muscle weakness, cramps, or limitation of movement.  Derm: Denies rash, itching, dry skin Psych: Denies depression, anxiety, memory loss, and confusion Heme: Denies bruising, bleeding, and enlarged lymph nodes.  Physical Exam: BP 133/86 mmHg  Pulse 72  Temp(Src) 97.2 F (36.2 C) (Oral)   Ht 5\' 2"  (1.575 m)  Wt 171 lb 6.4 oz (77.747 kg)  BMI 31.34 kg/m2 General:   Alert and oriented. Pleasant and cooperative. Well-nourished and well-developed.  Head:  Normocephalic and atraumatic. Eyes:  Without icterus, sclera clear and conjunctiva pink.  Ears:  Normal auditory acuity. Nose:  No deformity, discharge,  or lesions. Mouth:  No deformity or lesions, oral mucosa pink.  Neck:  Supple, without mass or thyromegaly. Lungs:  Clear to auscultation bilaterally. No wheezes, rales, or rhonchi. No distress.  Heart:  S1, S2 present without murmurs appreciated.  Abdomen:  +BS, soft, non-tender and non-distended. No HSM noted. No guarding or rebound. No masses appreciated.  Rectal:  Deferred  Msk:  Symmetrical without gross deformities. Normal posture. Pulses:  Normal pulses noted. Extremities:  Without clubbing or edema. Neurologic:  Alert and  oriented x4;  grossly normal neurologically. Skin:  Intact without significant lesions or rashes. Cervical Nodes:  No significant cervical adenopathy. Psych:  Alert and cooperative. Normal mood and affect.     04/09/2014 3:16 PM

## 2014-04-09 NOTE — Assessment & Plan Note (Signed)
Bowel movements range from 1 to 4 on bristol scale, with occasional straining, likely contributing to rectal bleeding. Patient states she has Miralax at home but does not want tot ry anything for constipation because "I've found my nromal pattern and I'm ok with it." Recommended her use Miralax prn for worsening constipation, prn.

## 2014-04-09 NOTE — Assessment & Plan Note (Signed)
Rectal bleeding into undergarments 2 months ago for 2-3 episodes. Has a history of on/off constipation. Unknown history of hemorrhoids. Likely benign anorectal source but cannot rule out more occult process or malignancy. Patient is less than a month from age 49 and would be appropriate for screening TCS anyway. Will proceed with colonosocopy for further evaluation of rectal bleeding.  Proceed with TCS with Dr. Gala Romney in near future: the risks, benefits, and alternatives have been discussed with the patient in detail. The patient states understanding and desires to proceed.

## 2014-04-09 NOTE — Patient Instructions (Signed)
1. We will set you up for your colonoscopy with Dr. Gala Romney 2. May try Miralax as needed for worsening constipation. 3. Further recommendations to be based on the results of your procedure.

## 2014-04-10 ENCOUNTER — Encounter: Payer: Self-pay | Admitting: Nurse Practitioner

## 2014-04-19 NOTE — Progress Notes (Signed)
CC'ED TO PCP 

## 2014-04-24 ENCOUNTER — Telehealth: Payer: Self-pay

## 2014-04-24 NOTE — Telephone Encounter (Signed)
Noted. Patuient is capable of making this decision for herself, although we do advise she have the procedure and any subsequent treatment needed.

## 2014-04-24 NOTE — Telephone Encounter (Signed)
WANTS TO CANCEL HER COLONOSCOPY BECAUSE IF THEY FIND CANCER SHE HAS NO ONE TO TAKE CARE OF HERSELF AND SHE JUST NEEDS TO LET NATURE TAKE ITS COURSE.

## 2014-05-08 DIAGNOSIS — M069 Rheumatoid arthritis, unspecified: Secondary | ICD-10-CM | POA: Insufficient documentation

## 2014-05-08 DIAGNOSIS — G35 Multiple sclerosis: Secondary | ICD-10-CM | POA: Insufficient documentation

## 2014-05-08 DIAGNOSIS — M2022 Hallux rigidus, left foot: Secondary | ICD-10-CM | POA: Insufficient documentation

## 2014-05-09 ENCOUNTER — Encounter (HOSPITAL_COMMUNITY): Admission: RE | Payer: Self-pay | Source: Ambulatory Visit

## 2014-05-09 ENCOUNTER — Ambulatory Visit (HOSPITAL_COMMUNITY): Admission: RE | Admit: 2014-05-09 | Payer: Medicare HMO | Source: Ambulatory Visit | Admitting: Internal Medicine

## 2014-05-09 SURGERY — COLONOSCOPY
Anesthesia: Moderate Sedation

## 2014-11-27 ENCOUNTER — Other Ambulatory Visit (HOSPITAL_COMMUNITY): Payer: Self-pay | Admitting: Family Medicine

## 2014-11-27 DIAGNOSIS — Z1231 Encounter for screening mammogram for malignant neoplasm of breast: Secondary | ICD-10-CM

## 2014-11-28 ENCOUNTER — Encounter: Payer: Self-pay | Admitting: Obstetrics and Gynecology

## 2014-12-10 ENCOUNTER — Encounter: Payer: Self-pay | Admitting: Obstetrics and Gynecology

## 2014-12-19 ENCOUNTER — Ambulatory Visit (HOSPITAL_COMMUNITY): Payer: Medicare HMO

## 2014-12-29 ENCOUNTER — Ambulatory Visit (INDEPENDENT_AMBULATORY_CARE_PROVIDER_SITE_OTHER): Payer: Medicare HMO | Admitting: Internal Medicine

## 2014-12-30 ENCOUNTER — Encounter (INDEPENDENT_AMBULATORY_CARE_PROVIDER_SITE_OTHER): Payer: Self-pay | Admitting: Internal Medicine

## 2014-12-30 ENCOUNTER — Ambulatory Visit (INDEPENDENT_AMBULATORY_CARE_PROVIDER_SITE_OTHER): Payer: Medicare HMO | Admitting: Internal Medicine

## 2014-12-30 VITALS — BP 114/82 | HR 64 | Temp 98.6°F | Ht 65.5 in | Wt 164.2 lb

## 2014-12-30 DIAGNOSIS — R634 Abnormal weight loss: Secondary | ICD-10-CM

## 2014-12-30 DIAGNOSIS — R1032 Left lower quadrant pain: Secondary | ICD-10-CM

## 2014-12-30 DIAGNOSIS — R140 Abdominal distension (gaseous): Secondary | ICD-10-CM

## 2014-12-30 DIAGNOSIS — R195 Other fecal abnormalities: Secondary | ICD-10-CM | POA: Diagnosis not present

## 2014-12-30 NOTE — Progress Notes (Signed)
Subjective:    Patient ID: Nancy Byrd, female    DOB: [birth year], 49 y.o.   MRN: [id number]  HPI Referred to our office by Dr. Olena Heckle for diarrhea, abdominal cramps and bloating. She has had symptoms for about a year. She has been evaluated at [facility] and colonoscopy was recommended.  She says last year she was working on a fence she developed a bruise. She says she has had symptoms since them.  She has diarrhea daily. She has 2-10 stools a day. Her stools usually loose. She has seen blood on the tissue and toilet.  She states she feels like something is "hanging out her rectum"/ She says she will have abdominal distention at time. (She actually showed me a picture of her abdomen while in the office). She has lost 4-5 pounds in the past 6 months. Her appetite is not good. She is scared to eat because she will hurt in her left lower abdomen. She does have some acid reflux.  She took a stool sample to [facility] and tested postive for tape dipylidium ,eggs and larva.  Stool studies negative at [facility] per patient.  Patient does not want to undergo a colonoscopy.  No family hx of colon cancer.    Review of Systems Past Medical History  Diagnosis Date  . Cancer ([place])   . Collagen vascular disease ([place])   . Multiple sclerosis ([place])   . Lumbar disc herniation     L1-L2; L4-L5  . Spinal stenosis   . DDD (degenerative disc disease), cervical   . Rheumatoid arthritis ([place])     right hand  . Carpal tunnel syndrome     right hand  . Tenosynovitis   . Heart murmur   . Diverticulitis   . Migraines   . Optic neuritis   . Neuropathy ([place])   . GERD (gastroesophageal reflux disease)   . Depression   . Hypercholesterolemia   . Pernicious anemia     Past Surgical History  Procedure Laterality Date  . Cardiac catheterization  06/13/06  . Tenosynovectomy Bilateral 1985  . Total abdominal hysterectomy  06/07/04  . Appendectomy  06/07/04  . Tonsilectomy, adenoidectomy, bilateral  myringotomy and tubes      as a child  . Nasal fracture surgery      as a child  . Cholecystectomy  1989    Allergies  Allergen Reactions  . Betadine [Povidone Iodine]   . Iohexol      Desc: ANAPHYLACTIC REACTION TO IODINE   . Shellfish Allergy     Current Outpatient Prescriptions on File Prior to Visit  Medication Sig Dispense Refill  . amphetamine-dextroamphetamine (ADDERALL XR) 30 MG 24 hr capsule Take 30 mg by mouth 2 (two) times daily.    . Ascorbic Acid (VITAMIN C) 1000 MG tablet Take 1,000 mg by mouth daily.    Marland Kitchen aspirin 81 MG tablet Take 81 mg by mouth daily.    . Calcium Carbonate-Vitamin D (CALCIUM + D PO) Take 1,200 mg by mouth daily.    . Cyanocobalamin (VITAMIN B-12 IJ) Inject as directed every 30 (thirty) days.    Marland Kitchen EPINEPHrine 0.3 mg/0.3 mL IJ SOAJ injection Inject 0.3 mg into the muscle once.    . furosemide (LASIX) 20 MG tablet Take 20 mg by mouth as needed.    Marland Kitchen rOPINIRole (REQUIP) 0.25 MG tablet Take 0.25 mg by mouth as needed.    Marland Kitchen SOYA LECITHIN PO Take 1,200 mg by mouth daily.    Marland Kitchen  topiramate (TOPAMAX) 200 MG tablet Take 200 mg by mouth 2 (two) times daily.    Marland Kitchen VITAMIN E PO Take 800 Units by mouth daily.    Marland Kitchen zolmitriptan (ZOMIG) 5 MG tablet Take 5 mg by mouth as needed for migraine.    . Coenzyme Q10 (CO Q 10) 10 MG CAPS Take 10 mg by mouth daily.    . Flax OIL Take 1,000 mg by mouth daily.    Marland Kitchen omeprazole (PRILOSEC) 20 MG capsule Take 20 mg by mouth daily.    Marland Kitchen oxybutynin (DITROPAN) 5 MG tablet Take 5 mg by mouth 2 (two) times daily.    . polyethylene glycol-electrolytes (NULYTELY/GOLYTELY) 420 G solution Take 4,000 mLs by mouth once. (Patient not taking: Reported on 12/30/2014) 4000 mL 0  . UNABLE TO FIND Narcolepsy 40 mg po BID    . Venlafaxine HCl (EFFEXOR PO) Take 450 mg by mouth daily.     No current facility-administered medications on file prior to visit.        Objective:   Physical Exam Blood pressure 114/82, pulse 64, temperature 98.6 F  (37 C), height 5' 5.5" (1.664 m), weight 164 lb 3.2 oz (74.481 kg). Alert and oriented. Skin warm and dry. Oral mucosa is moist.   . Sclera anicteric, conjunctivae is pink. Thyroid not enlarged. No cervical lymphadenopathy. Lungs clear. Heart regular rate and rhythm.  Abdomen is soft. Bowel sounds are positive. No hepatomegaly. No abdominal masses felt.Lower abdominal  tenderness.  No edema to lower extremities.  No stool and guaiac negative.       Assessment & Plan:  Diarrhea: Imodium BID.  GI pathogen. CBC. CT abdomen/pelvis with CM. Further recommendations to follow.

## 2014-12-30 NOTE — Patient Instructions (Signed)
GI pathogen. CBC. CT abdomen/pelvis with CM

## 2015-01-05 ENCOUNTER — Other Ambulatory Visit (INDEPENDENT_AMBULATORY_CARE_PROVIDER_SITE_OTHER): Payer: Self-pay | Admitting: Internal Medicine

## 2015-01-05 ENCOUNTER — Ambulatory Visit (HOSPITAL_COMMUNITY)
Admission: RE | Admit: 2015-01-05 | Discharge: 2015-01-05 | Disposition: A | Discharge status: Home / Self Care | Payer: Medicare HMO | Source: Ambulatory Visit | Attending: Internal Medicine | Admitting: Internal Medicine

## 2015-01-05 DIAGNOSIS — R634 Abnormal weight loss: Secondary | ICD-10-CM

## 2015-01-05 DIAGNOSIS — R140 Abdominal distension (gaseous): Secondary | ICD-10-CM | POA: Diagnosis present

## 2015-01-05 DIAGNOSIS — R195 Other fecal abnormalities: Secondary | ICD-10-CM

## 2015-01-06 LAB — CBC WITH DIFFERENTIAL/PLATELET
BASOS ABS: 0.1 10*3/uL (ref 0.0–0.1)
Basophils Relative: 1 % (ref 0–1)
Eosinophils Absolute: 0.1 10*3/uL (ref 0.0–0.7)
Eosinophils Relative: 2 % (ref 0–5)
HEMATOCRIT: 37.1 % (ref 36.0–46.0)
HEMOGLOBIN: 11.7 g/dL — AB (ref 12.0–15.0)
LYMPHS PCT: 28 % (ref 12–46)
Lymphs Abs: 1.5 10*3/uL (ref 0.7–4.0)
MCH: 29.3 pg (ref 26.0–34.0)
MCHC: 31.5 g/dL (ref 30.0–36.0)
MCV: 92.8 fL (ref 78.0–100.0)
MONO ABS: 0.5 10*3/uL (ref 0.1–1.0)
MPV: 10.6 fL (ref 8.6–12.4)
Monocytes Relative: 10 % (ref 3–12)
NEUTROS ABS: 3.1 10*3/uL (ref 1.7–7.7)
Neutrophils Relative %: 59 % (ref 43–77)
Platelets: 291 10*3/uL (ref 150–400)
RBC: 4 MIL/uL (ref 3.87–5.11)
RDW: 13.8 % (ref 11.5–15.5)
WBC: 5.3 10*3/uL (ref 4.0–10.5)

## 2015-01-08 LAB — GASTROINTESTINAL PATHOGEN PANEL PCR
C. difficile Tox A/B, PCR: NEGATIVE
CAMPYLOBACTER, PCR: NEGATIVE
CRYPTOSPORIDIUM, PCR: NEGATIVE
E COLI 0157, PCR: NEGATIVE
E coli (ETEC) LT/ST PCR: NEGATIVE
E coli (STEC) stx1/stx2, PCR: NEGATIVE
Giardia lamblia, PCR: NEGATIVE
Norovirus, PCR: NEGATIVE
Rotavirus A, PCR: NEGATIVE
SALMONELLA, PCR: NEGATIVE
Shigella, PCR: NEGATIVE

## 2015-01-14 ENCOUNTER — Ambulatory Visit (HOSPITAL_COMMUNITY)
Admission: RE | Admit: 2015-01-14 | Discharge: 2015-01-14 | Disposition: A | Discharge status: Home / Self Care | Payer: Medicare HMO | Source: Ambulatory Visit | Attending: Family Medicine | Admitting: Family Medicine

## 2015-01-14 ENCOUNTER — Ambulatory Visit (HOSPITAL_COMMUNITY): Payer: Medicare HMO

## 2015-01-14 DIAGNOSIS — Z1231 Encounter for screening mammogram for malignant neoplasm of breast: Secondary | ICD-10-CM | POA: Diagnosis not present

## 2015-01-16 ENCOUNTER — Encounter (INDEPENDENT_AMBULATORY_CARE_PROVIDER_SITE_OTHER): Payer: Self-pay

## 2015-02-16 ENCOUNTER — Telehealth: Payer: Self-pay | Admitting: Gastroenterology

## 2015-02-18 ENCOUNTER — Encounter: Payer: Self-pay | Admitting: Neurology

## 2015-02-18 ENCOUNTER — Ambulatory Visit (INDEPENDENT_AMBULATORY_CARE_PROVIDER_SITE_OTHER): Payer: Medicare HMO | Admitting: Neurology

## 2015-02-18 VITALS — BP 144/92 | HR 66 | Resp 16 | Ht 66.0 in | Wt 169.2 lb

## 2015-02-18 DIAGNOSIS — R5383 Other fatigue: Secondary | ICD-10-CM | POA: Diagnosis not present

## 2015-02-18 DIAGNOSIS — R4189 Other symptoms and signs involving cognitive functions and awareness: Secondary | ICD-10-CM | POA: Diagnosis not present

## 2015-02-18 DIAGNOSIS — Z79899 Other long term (current) drug therapy: Secondary | ICD-10-CM | POA: Diagnosis not present

## 2015-02-18 DIAGNOSIS — N39498 Other specified urinary incontinence: Secondary | ICD-10-CM | POA: Diagnosis not present

## 2015-02-18 DIAGNOSIS — F418 Other specified anxiety disorders: Secondary | ICD-10-CM | POA: Insufficient documentation

## 2015-02-18 DIAGNOSIS — G35 Multiple sclerosis: Secondary | ICD-10-CM

## 2015-02-18 DIAGNOSIS — R269 Unspecified abnormalities of gait and mobility: Secondary | ICD-10-CM

## 2015-02-18 DIAGNOSIS — R32 Unspecified urinary incontinence: Secondary | ICD-10-CM | POA: Insufficient documentation

## 2015-02-18 MED ORDER — ESCITALOPRAM OXALATE 10 MG PO TABS: 10.0000 mg | ORAL_TABLET | Freq: Every day | ORAL | Status: DC

## 2015-02-18 MED ORDER — NADOLOL 20 MG PO TABS: 20.0000 mg | ORAL_TABLET | Freq: Every day | ORAL | Status: DC

## 2015-02-18 MED ORDER — SOLIFENACIN SUCCINATE 10 MG PO TABS: 10.0000 mg | ORAL_TABLET | Freq: Every day | ORAL | Status: DC

## 2015-02-18 NOTE — Progress Notes (Signed)
[facility]  PATIENT: Nancy Byrd DOB: [birth year]  REFERRING DOCTOR OR PCP:  Gar Ponto  Fax: [phone] SOURCE: patient, and records and MRI images on PACS  _________________________________   HISTORICAL  CHIEF COMPLAINT:  Chief Complaint  Patient presents with  . Multiple Sclerosis    Nancy Byrd is here with her mother Nancy Byrd for eval of MS.  Sts. she was dx. in 2004.  Presenting sx. was dizziness.  Sts. dx. confirmed with MRI and LP.  Sts she has seen several neurologists, including Dr. Dellis Filbert, Dr. Jannifer Franklin, and someone at [facility].  Sts. she was initially treated with IV steroids, then started Rebif and was on this for over one yr., stopped due to anaphylaxix.  Sts. she has not been on any other MS med.  (since about 2007)/Nancy Byrd  . Tremors    She would also like eval for tremors in both hands, head, onset 2 yrs. ago.  Sts. she has never been on any med for tremors.Nancy Byrd  . Narcolepsy    Sts. she has had a sleep study--can't remember where, and pcp dx. her with narcolepsy/Nancy Byrd    HISTORY OF PRESENT ILLNESS:  I had the pleasure seeing you patient, Nancy Byrd, at [facility] for neurologic consultation regarding her multiple sclerosis.   MS History:    She was diagnosed in 2004 after presenting with dizziness. At that time, she was being treated with Enbrel for her rheumatoid arthritis. She had an MRI and lumbar puncture confirming the diagnosis. She also had several episodes of optic neuritis. She was treated with steroids for her initial symptoms of the optic neuritis. She was placed on Rebif. She was on Rebif for about a year but then stopped due to severe side effects. She reports having an anaphylactic reaction and be hospitalized and having a cardiac workup. She stopped the Rebif and has not gone any disease modifying therapy since that time, around 2007.   She has seen several neurologists including Dr. Jannifer Franklin, Dr. Jacqulynn Cadet and  Dr. Otelia [facility]).    She reports that Tysabri was discussed with her but due to potential of PML she opted not to go on the medication. Her last MRI of the brain is from 2009. I personally reviewed the images. She has several T2/FLAIR hyperintense foci including some in the periventricular white matter. The MRIs of the cervical and thoracic spine from 2011 show some degenerative changes but did not show any MS changes.     Her dad had MS.   Gait/strength/sensation:   She has had some difficulties with her gait and notes a left foot drop. She reports that the gait was worse around 2010 and she used to use a walker. She has spasticity, especially in her left leg.     She has more trouble walking on shiny floors like in stores, compared to carpets.   She denies any significant numbness.   She has had a tremor in her arms the past year.     Vision:  She has no major difficulties with her vision. There is no diplopia.  Bladder:   She has had a lot of difficulties with bladder frequency and urgency and incontinence over the past year. This has worsened quite a bit recently.  Fatigue/sleep: She has had a lot of difficulties with fatigue and sleepiness during the daytime. She gets some benefit from Adderall XR 30 mg twice a day. He has difficulty falling asleep and staying asleep. Because she lives alone she  does not like to take medications like temazepam or gabapentin a regular basis. She does note restless leg syndrome with some benefit from ropinirole and gabapentin.  Mood/cognition:   She reports difficulties with depression. She is much more irritable the last year. There is some anxiety. She also has had difficulties with cognition. Specifically she has worsening focus. She has had difficulties with executive function, verbal fluency and short-term memory.  Math is much more difficult She uses lists to help her remember.     Tremor:    She has a fast low to mid amplitude tremor, worse over the  past 2-3 years.   Many uncles, aunts and cousins have a similar tremor.      REVIEW OF SYSTEMS: Constitutional: No fevers, chills, sweats, or change in appetite.   She has fatigue and poor sleep. She has restless leg syndrome. Eyes: No visual changes, double vision, eye pain Ear, nose and throat: No hearing loss, ear pain, nasal congestion, sore throat Cardiovascular: No chest pain, palpitations Respiratory: No shortness of breath at rest or with exertion.   No wheezes GastrointestinaI: No nausea, vomiting, diarrhea, abdominal pain, fecal incontinence Genitourinary: as above. Musculoskeletal: Reports some muscle and joint pain Integumentary: No rash, pruritus, skin lesions Neurological: as above Psychiatric: Notes depression and anxiety Endocrine: No palpitations, diaphoresis, change in appetite, change in weigh or increased thirst Hematologic/Lymphatic: No anemia, purpura, petechiae. Allergic/Immunologic: No itchy/runny eyes, nasal congestion, recent allergic reactions, rashes  ALLERGIES: Allergies  Allergen Reactions  . Etanercept Anaphylaxis  . Interferon Beta-1a Anaphylaxis  . Betadine [Povidone Iodine]   . Shellfish Allergy   . Amoxicillin Rash  . Iohexol Rash     Desc: ANAPHYLACTIC REACTION TO IODINE  Desc: ANAPHYLACTIC REACTION TO IODINE     HOME MEDICATIONS:  Current outpatient prescriptions:  .  aluminum hydroxide-magnesium carbonate (GAVISCON) 95-358 MG/15ML SUSP, Take by mouth as needed., Disp: , Rfl:  .  amphetamine-dextroamphetamine (ADDERALL XR) 30 MG 24 hr capsule, Take 30 mg by mouth 2 (two) times daily., Disp: , Rfl:  .  Ascorbic Acid (VITAMIN C) 1000 MG tablet, Take 1,000 mg by mouth daily., Disp: , Rfl:  .  aspirin 81 MG tablet, Take 81 mg by mouth daily., Disp: , Rfl:  .  Calcium Carbonate-Vitamin D (CALCIUM + D PO), Take 1,200 mg by mouth daily., Disp: , Rfl:  .  cetirizine (ZYRTEC) 10 MG tablet, Take 10 mg by mouth daily., Disp: , Rfl:  .  Coenzyme  Q10 (CO Q 10) 10 MG CAPS, Take 10 mg by mouth daily., Disp: , Rfl:  .  Cyanocobalamin (VITAMIN B-12 IJ), Inject as directed every 30 (thirty) days., Disp: , Rfl:  .  EPINEPHrine 0.3 mg/0.3 mL IJ SOAJ injection, Inject 0.3 mg into the muscle once., Disp: , Rfl:  .  Flax OIL, Take 1,000 mg by mouth daily., Disp: , Rfl:  .  furosemide (LASIX) 20 MG tablet, Take 20 mg by mouth as needed., Disp: , Rfl:  .  gabapentin (NEURONTIN) 600 MG tablet, Take 600 mg by mouth 3 (three) times daily as needed., Disp: , Rfl:  .  ibuprofen (ADVIL,MOTRIN) 800 MG tablet, Take 800 mg by mouth every 8 (eight) hours as needed., Disp: , Rfl:  .  lactase (LACTAID) 3000 UNITS tablet, Take by mouth 3 (three) times daily with meals., Disp: , Rfl:  .  meclizine (ANTIVERT) 25 MG tablet, Take 25 mg by mouth 3 (three) times daily as needed for dizziness., Disp: , Rfl:  .  polyethylene glycol-electrolytes (NULYTELY/GOLYTELY) 420 G solution, Take 4,000 mLs by mouth once., Disp: 4000 mL, Rfl: 0 .  Probiotic Product (ALIGN PO), Take by mouth., Disp: , Rfl:  .  rOPINIRole (REQUIP) 0.25 MG tablet, Take 0.25 mg by mouth as needed., Disp: , Rfl:  .  SOYA LECITHIN PO, Take 1,200 mg by mouth daily., Disp: , Rfl:  .  topiramate (TOPAMAX) 200 MG tablet, Take 200 mg by mouth 2 (two) times daily., Disp: , Rfl:  .  UBIQUINOL PO, Take by mouth., Disp: , Rfl:  .  VITAMIN E PO, Take 800 Units by mouth daily., Disp: , Rfl:  .  zolmitriptan (ZOMIG) 5 MG tablet, Take 5 mg by mouth as needed for migraine., Disp: , Rfl:  .  ALPRAZolam (XANAX XR) 0.5 MG 24 hr tablet, Take 0.5 mg by mouth as needed for anxiety. Reported on 02/18/2015, Disp: , Rfl:  .  omeprazole (PRILOSEC) 20 MG capsule, Take 20 mg by mouth daily. Reported on 02/18/2015, Disp: , Rfl:  .  oxybutynin (DITROPAN) 5 MG tablet, Take 5 mg by mouth 2 (two) times daily. Reported on 02/18/2015, Disp: , Rfl:  .  potassium chloride SA (K-DUR,KLOR-CON) 20 MEQ tablet, , Disp: , Rfl:  .  scopolamine  (TRANSDERM-SCOP) 1 MG/3DAYS, Place 1 patch onto the skin as needed. Reported on 02/18/2015, Disp: , Rfl:  .  temazepam (RESTORIL) 15 MG capsule, Take 15 mg by mouth at bedtime as needed for sleep. Reported on 02/18/2015, Disp: , Rfl:  .  UNABLE TO FIND, Reported on 02/18/2015, Disp: , Rfl:  .  Venlafaxine HCl (EFFEXOR PO), Take 450 mg by mouth daily. Reported on 02/18/2015, Disp: , Rfl:   PAST MEDICAL HISTORY: Past Medical History  Diagnosis Date  . Cancer ([place])   . Collagen vascular disease ([place])   . Multiple sclerosis ([place])   . Lumbar disc herniation     L1-L2; L4-L5  . Spinal stenosis   . DDD (degenerative disc disease), cervical   . Rheumatoid arthritis ([place])     right hand  . Carpal tunnel syndrome     right hand  . Tenosynovitis   . Heart murmur   . Diverticulitis   . Migraines   . Optic neuritis   . Neuropathy ([place])   . GERD (gastroesophageal reflux disease)   . Depression   . Hypercholesterolemia   . Pernicious anemia     PAST SURGICAL HISTORY: Past Surgical History  Procedure Laterality Date  . Cardiac catheterization  06/13/06  . Tenosynovectomy Bilateral 1985  . Total abdominal hysterectomy  06/07/04  . Appendectomy  06/07/04  . Tonsilectomy, adenoidectomy, bilateral myringotomy and tubes      as a child  . Nasal fracture surgery      as a child  . Cholecystectomy  1989    FAMILY HISTORY: Family History  Problem Relation Age of Onset  . Colon cancer Neg Hx     SOCIAL HISTORY:  Social History   Social History  . Marital Status: Widowed    Spouse Name: N/A  . Number of Children: N/A  . Years of Education: N/A   Occupational History  . Not on file.   Social History Main Topics  . Smoking status: Never Smoker   . Smokeless tobacco: Not on file  . Alcohol Use: No  . Drug Use: No  . Sexual Activity: Not on file   Other Topics Concern  . Not on file   Social History Narrative  PHYSICAL EXAM  Filed Vitals:   02/18/15 1334  BP:  144/92  Pulse: 66  Resp: 16  Height: 5\' 6"  (1.676 m)  Weight: 169 lb 3.2 oz (76.749 kg)    Body mass index is 27.32 kg/(m^2).   General: The patient is well-developed and well-nourished and in no acute distress  Eyes:  Funduscopic exam shows normal optic discs and retinal vessels.  Neck: The neck is supple, no carotid bruits are noted.  The neck is nontender.  Cardiovascular: The heart has a regular rate and rhythm with a normal S1 and S2. There were no murmurs, gallops or rubs. Lungs are clear to auscultation.  Skin: Extremities are without rash.   Nancy Byrd has mild pedal/ankle edema.  Musculoskeletal:  Back is nontender.   Nodules on hand and toe joints.  Neurologic Exam  Mental status: The patient is alert and oriented x 3 at the time of the examination. The patient has apparent normal recent and remote memory, with an apparently normal attention span and concentration ability.   Speech is normal.  Cranial nerves: Extraocular movements are full. Pupils are equal, round, and reactive to light and accomodation.  Visual fields are full.  Facial symmetry is present. There is good facial sensation to soft touch bilaterally.Facial strength is normal.  Trapezius and sternocleidomastoid strength is normal. No dysarthria is noted.  The tongue is midline, and the patient has symmetric elevation of the soft palate. No obvious hearing deficits are noted.  Motor:  7 hz low amplitude tremor increases when trying to hold still.    Muscle bulk is normal.   Tone is slightly increased in legs, worse on the left. Strength is  5 / 5 in all 4 extremities.   Sensory: Sensory testing is intact to pinprick, soft touch and vibration sensation in all 4 extremities.  Coordination: Cerebellar testing reveals good finger-nose-finger and mildly reduced left heel-to-shin bilaterally.  Gait and station: Station is normal.   Gait shows left foot drop. Tandem gait is wide. Romberg is negative.   Reflexes: Deep  tendon reflexes are increased in legs, left > right.   Plantar responses are flexor.    DIAGNOSTIC DATA (LABS, IMAGING, TESTING) - I reviewed patient records, labs, notes, testing and imaging myself where available.  Lab Results  Component Value Date   WBC 5.3 01/06/2015   HGB 11.7* 01/06/2015   HCT 37.1 01/06/2015   MCV 92.8 01/06/2015   PLT 291 01/06/2015       ASSESSMENT AND PLAN  Multiple sclerosis (HCC)  Gait disturbance  High risk medication use  Other urinary incontinence  Depression with anxiety  Disturbed cognition   In summary, Nancy Byrd is a 49 year old woman with multiple sclerosis who had a gait disturbance, urinary incontinence and difficulties with mood, fatigue and cognition.   I discussed the importance of getting restarted on a disease modifying therapy. She has a lot of concerns about safety issues and tolerability issues. She had issues with Rebif and I would not use a related medication. We discussed the oral agents. She is potentially interested in Aubagio and I will check some blood work for CBC, LFT and TB.   I will also check an MRI of the brain and cervical spine to help determine the etiology of her foot drop. I'll compare these MRI images with the older images. She appears to have an essential tremor and I will start nadolol. Additionally she has urinary incontinence. She had a dry mouth with oxybutynin. I will  place her on Vesicare as it is sometimes better tolerated.  She will return to see me in 2 or 3 months or sooner if there are new or worsening neurologic symptoms.  Thank you for asking me to see Nancy Byrd for a neurologic consultation. Please [phone] and be of further assistance with her or other patients in the future.  Check labs MRI brain and cervical spine c/s contrast. Beta blocker for essential tremor.      Norvell Ureste A. Felecia Shelling, MD, PhD [id number], [id number] PM Certified in Neurology, Clinical Neurophysiology, Sleep Medicine, Pain  Medicine and Neuroimaging  [facility] [address] [place], [place] [address] [phone]

## 2015-02-19 LAB — CBC WITH DIFFERENTIAL
BASOS: 1 %
Basophils Absolute: 0.1 10*3/uL (ref 0.0–0.2)
EOS (ABSOLUTE): 0.2 10*3/uL (ref 0.0–0.4)
EOS: 4 %
HEMOGLOBIN: 11.5 g/dL (ref 11.1–15.9)
Hematocrit: 36.1 % (ref 34.0–46.6)
IMMATURE GRANS (ABS): 0 10*3/uL (ref 0.0–0.1)
Immature Granulocytes: 0 %
LYMPHS: 34 %
Lymphocytes Absolute: 1.8 10*3/uL (ref 0.7–3.1)
MCH: 29 pg (ref 26.6–33.0)
MCHC: 31.9 g/dL (ref 31.5–35.7)
MCV: 91 fL (ref 79–97)
MONOCYTES: 12 %
Monocytes Absolute: 0.6 10*3/uL (ref 0.1–0.9)
NEUTROS ABS: 2.6 10*3/uL (ref 1.4–7.0)
NEUTROS PCT: 49 %
RBC: 3.97 x10E6/uL (ref 3.77–5.28)
RDW: 14.2 % (ref 12.3–15.4)
WBC: 5.3 10*3/uL (ref 3.4–10.8)

## 2015-02-19 LAB — HEPATIC FUNCTION PANEL
ALT: 19 IU/L (ref 0–32)
AST: 24 IU/L (ref 0–40)
Albumin: 3.9 g/dL (ref 3.5–5.5)
Alkaline Phosphatase: 112 IU/L (ref 39–117)
Bilirubin Total: 0.4 mg/dL (ref 0.0–1.2)
Bilirubin, Direct: 0.12 mg/dL (ref 0.00–0.40)
TOTAL PROTEIN: 6.3 g/dL (ref 6.0–8.5)

## 2015-02-19 NOTE — Telephone Encounter (Signed)
Dr. Armbruster reviewed records and has accepted patient. Ok to schedule OV. Left message for patient to return my call.  °

## 2015-02-21 LAB — QUANTIFERON TB GOLD ASSAY (BLOOD)

## 2015-02-21 LAB — QUANTIFERON IN TUBE
QFT TB AG MINUS NIL VALUE: 0 IU/mL
QUANTIFERON NIL VALUE: 0.04 [IU]/mL
QUANTIFERON TB AG VALUE: 0.04 [IU]/mL
QUANTIFERON TB GOLD: NEGATIVE

## 2015-02-24 NOTE — Telephone Encounter (Signed)
Per Barbera Setters. We cannot advise patient if she can wait until March for this since we have never seen patient before. Patient may want to see her primary care doctor until she is seen. Left message for patient to return my call.

## 2015-02-24 NOTE — Telephone Encounter (Signed)
First available with Dr. Havery Moros is not until March. Patient is wanting to know if it is ok for her to wait until March to be seen? She states that she has been diagnosed with a rectal prolapse. Patient states that sometimes when she has gas it comes out of her vaginal area. Best # [phone] or [phone].

## 2015-03-04 ENCOUNTER — Telehealth: Payer: Self-pay | Admitting: *Deleted

## 2015-03-04 ENCOUNTER — Other Ambulatory Visit: Payer: Self-pay | Admitting: Neurology

## 2015-03-04 NOTE — Telephone Encounter (Signed)
LMOM for pt. to call [facility] to schedule mri's/fim

## 2015-03-04 NOTE — Telephone Encounter (Signed)
I sent MRI orders to [facility] on 12/21 &  Imaging called/left voicemail for patient on 02/18/15 trying to schedule her appointment. Patient needs to contact [facility] [phone] to schedule. Thanks!

## 2015-03-04 NOTE — Telephone Encounter (Signed)
-----   Message from Britt Bottom, MD sent at 03/04/2015  8:23 AM EST ----- Her labs look fine and we can start Aubagio as we discussed at the last visit.  I ordered an MRI of the brain and cervical spine but it does not seem that it is scheduled yet. Please ask her if it has been scheduled and if not we can try to get that done.

## 2015-03-04 NOTE — Telephone Encounter (Signed)
Left message with mother for pt. to call/fim

## 2015-03-04 NOTE — Telephone Encounter (Signed)
I have spoken with Jimi this afternoon, and per RAS, advised that labwork was ok, so I have faxed Aubagio start form to [facility].  She verbalized understanding of same, sts. she has further questions regarding Aubagio. She is driving now, but will call me back when she is able, to discuss, and if needed, sched. another appt. with RAS/fim

## 2015-03-10 DIAGNOSIS — H938X3 Other specified disorders of ear, bilateral: Secondary | ICD-10-CM | POA: Diagnosis not present

## 2015-03-10 DIAGNOSIS — H6123 Impacted cerumen, bilateral: Secondary | ICD-10-CM | POA: Diagnosis not present

## 2015-03-11 ENCOUNTER — Encounter: Payer: Self-pay | Admitting: *Deleted

## 2015-03-11 ENCOUNTER — Telehealth: Payer: Self-pay | Admitting: Neurology

## 2015-03-11 NOTE — Telephone Encounter (Signed)
[name]/MS one to one [phone] ext [phone] called to advise Section 4 on AUBAGIO start form needs to be corrected, patient's insurance is no longer [place], insurance changed 03/01/15 to [employer] Subscriber# [id number] Grp# [id number], Phone number [phone].

## 2015-03-11 NOTE — Telephone Encounter (Signed)
I have spoken with [name] this afternoon and advised that I have faxed a new Aubagio start form in for Nancy Byrd, with new insurance info noted/fim

## 2015-03-20 ENCOUNTER — Ambulatory Visit
Admission: RE | Admit: 2015-03-20 | Discharge: 2015-03-20 | Disposition: A | Discharge status: Home / Self Care | Payer: PPO | Source: Ambulatory Visit | Attending: Neurology | Admitting: Neurology

## 2015-03-20 DIAGNOSIS — G35 Multiple sclerosis: Secondary | ICD-10-CM | POA: Diagnosis not present

## 2015-03-20 DIAGNOSIS — N39498 Other specified urinary incontinence: Secondary | ICD-10-CM

## 2015-03-20 DIAGNOSIS — R269 Unspecified abnormalities of gait and mobility: Secondary | ICD-10-CM

## 2015-03-20 DIAGNOSIS — M4802 Spinal stenosis, cervical region: Secondary | ICD-10-CM | POA: Diagnosis not present

## 2015-03-20 MED ORDER — GADOBENATE DIMEGLUMINE 529 MG/ML IV SOLN
15.0000 mL | Freq: Once | INTRAVENOUS | Status: AC | PRN
  Administered 2015-03-20: 15 mL via INTRAVENOUS

## 2015-03-23 ENCOUNTER — Telehealth: Payer: Self-pay | Admitting: *Deleted

## 2015-03-23 DIAGNOSIS — M4802 Spinal stenosis, cervical region: Secondary | ICD-10-CM

## 2015-03-23 NOTE — Telephone Encounter (Signed)
-----   Message from Britt Bottom, MD sent at 03/20/2015  5:22 PM EST ----- Please let her know that the MRI of the brain is unchanged compared to her previous one. The MRI of the cervical spine does show moderate spinal stenosis at the one level and it is worse than her previous MRI therefore, I would like her to see neurosurgery as the spinal stenosis might be contributing to some of her symptoms

## 2015-03-23 NOTE — Telephone Encounter (Signed)
Patient returned your call.  Call back [phone].

## 2015-03-23 NOTE — Telephone Encounter (Signed)
Left message with pt's mother for her to call/fim

## 2015-03-23 NOTE — Telephone Encounter (Signed)
Patient "missed Nancy Byrd's phone call earlier, states she called back but still hasn't heard anything".

## 2015-03-23 NOTE — Telephone Encounter (Signed)
I have spoken with Heiley and per RAS, advised that recent mri brain shows no new changes when compared to her previous mri; there is moderate stenosis at one level in the c-spine and RAS would like to refer her to NS for eval to see if stenosis is contributing to some of her sx.  She is agreeable and requests referral to Dr. Patrice Paradise.  Order entered into epic/fim

## 2015-03-30 ENCOUNTER — Encounter: Payer: Self-pay | Admitting: *Deleted

## 2015-04-14 ENCOUNTER — Telehealth: Payer: Self-pay

## 2015-04-14 NOTE — Telephone Encounter (Signed)
2 meds Dr. Felecia Shelling put her on is not agreeing with her...please advise Nadolol (shaking) dropping heart rate and she is falling besicare (urinary incontinence) messing with her migraines

## 2015-04-14 NOTE — Telephone Encounter (Signed)
LMTC./fim 

## 2015-04-15 ENCOUNTER — Ambulatory Visit: Payer: Self-pay | Admitting: Neurology

## 2015-04-15 NOTE — Telephone Encounter (Signed)
Pt called in and does not want to come in today.She has r/s for tomorrow afternoon.

## 2015-04-15 NOTE — Telephone Encounter (Signed)
noted/fim 

## 2015-04-15 NOTE — Telephone Encounter (Signed)
I have spoken with Nancy Byrd.  She sts. at night when she gets up quickly to go to the bathroom, she is getting dizzy--has fallen several times, hitting her face/head on the nightstand.  Believes Nadolol is causing this.  She also sts. migraines are more frequent and more severe with Vesicare.  Appt. given to discuss with RAS at 1420 this afternoon/fim

## 2015-04-16 ENCOUNTER — Ambulatory Visit (INDEPENDENT_AMBULATORY_CARE_PROVIDER_SITE_OTHER): Payer: PPO | Admitting: Neurology

## 2015-04-16 ENCOUNTER — Encounter: Payer: Self-pay | Admitting: Neurology

## 2015-04-16 VITALS — BP 164/92 | HR 74 | Resp 18 | Ht 66.0 in | Wt 171.4 lb

## 2015-04-16 DIAGNOSIS — R269 Unspecified abnormalities of gait and mobility: Secondary | ICD-10-CM | POA: Diagnosis not present

## 2015-04-16 DIAGNOSIS — F418 Other specified anxiety disorders: Secondary | ICD-10-CM

## 2015-04-16 DIAGNOSIS — G35 Multiple sclerosis: Secondary | ICD-10-CM

## 2015-04-16 DIAGNOSIS — Z79899 Other long term (current) drug therapy: Secondary | ICD-10-CM | POA: Diagnosis not present

## 2015-04-16 DIAGNOSIS — R5383 Other fatigue: Secondary | ICD-10-CM

## 2015-04-16 DIAGNOSIS — N39498 Other specified urinary incontinence: Secondary | ICD-10-CM | POA: Diagnosis not present

## 2015-04-16 DIAGNOSIS — R4189 Other symptoms and signs involving cognitive functions and awareness: Secondary | ICD-10-CM | POA: Diagnosis not present

## 2015-04-16 MED ORDER — PROPRANOLOL HCL ER 60 MG PO CP24: 60.0000 mg | ORAL_CAPSULE | Freq: Every day | ORAL | Status: DC

## 2015-04-16 NOTE — Progress Notes (Signed)
[facility]  PATIENT: Nancy Byrd DOB: Jun 11, 1965  REFERRING DOCTOR OR PCP:  Gar Ponto  Fax: [phone] SOURCE: patient, and records and MRI images on PACS  _________________________________   HISTORICAL  CHIEF COMPLAINT:  Chief Complaint  Patient presents with  . Multiple Sclerosis    She is not currently taking any MS med--mri has been stable.  She is here today to discuss meds--she feels Nadolol is contributing to incontinence, and feels Vesicare caused worsening h/a's/fim    HISTORY OF PRESENT ILLNESS:  Nancy Byrd is a 50 yo woman with MS.   She is currently not on any DMT by choice.     I personally reviewed her brain and cervical spine MRI in Peggi's presence.    MRI of the brain shows some white matter foci, some periventricular c/w MS but others in pons or subcortical more c/w chronic microvascular changes.   It was unchanged compared to 2006/06/22.   MRI cervical spine showed multilevel DJD with moderate spinal stenosis at C5C6 and mild spinal stenosis at C4C5.   No demyelinating lesions or myelopathic signal.     Gait/strength/sensation:   She has a reduced gait and feels her right leg drags when she is tired.   She used to use a walker but now is able to walk without a cane.  She has spasticity in her legs.     She has more trouble walking on shiny floors like in stores, compared to carpets.   She denies any significant numbness.   She has had a tremor in her arms the past year.     Cervical stenosis:   She has moderate C5C6 spinal stenosis which could be contributing to her bladder and gait issues.   She sees Dr. Patrice Paradise 04/30/15  Vision:  She has no major difficulties with her vision. There is no diplopia.  Bladder:   She has severe bladder frequency and urgency and incontinence over the past year. This has worsened quite a bit recently.   Oxybutynin and Vesicare had not helped much  Fatigue/sleep: She has had a lot of difficulties with fatigue  and sleepiness during the daytime. She gets some benefit from Adderall XR 30 mg twice a day. He has difficulty falling asleep and staying asleep. Because she lives alone she does not like to take medications like temazepam or gabapentin a regular basis. She does note restless leg syndrome with some benefit from ropinirole and gabapentin.  Mood/cognition:   She has some depression off/on sine husband dies from GBM in 06-22-2006. She is more irritable the last year. There is some anxiety. She also has had difficulties with cognition. She notes poor focus, reduced executive function, verbal fluency and short-term memory.    Tremor:    She reports that she had lightheadedness and low BP when she went on nadolol and she stopped.   She thinks it may have minimally helped her tremors.   She has a fast low to mid amplitude tremor, worse over the past 2-3 years.   Many uncles, aunts and cousins have a similar tremor.     Edema:   She has mild edema in her ankles.   She states lasix has not helped much.    Stockings helped some but only when worn and they were not comfortable.   She states something her chiropractor did helped some.   Legs throb the most when resting and walking reduces pain there.     MS History:  She was diagnosed in 2004 after presenting with dizziness. At that time, she was being treated with Enbrel for her rheumatoid arthritis. She had an MRI and lumbar puncture confirming the diagnosis. She also had several episodes of optic neuritis. She was treated with steroids for her initial symptoms of the optic neuritis. She was placed on Rebif. She was on Rebif for about a year but then stopped due to severe side effects. She reports having an anaphylactic reaction and be hospitalized and having a cardiac workup. She stopped the Rebif and has not gone any disease modifying therapy since that time, around 2007.   She has seen several neurologists including Dr. Jannifer Franklin, Dr. Jacqulynn Cadet and Dr. Otelia Sergeant [facility]).     She reports that Tysabri was discussed with her but due to potential of PML she opted not to go on the medication. Her last MRI of the brain is from 2009. I personally reviewed the images. She has several T2/FLAIR hyperintense foci including some in the periventricular white matter. The MRIs of the cervical and thoracic spine from 2011 show some degenerative changes but did not show any MS changes.     Her dad had MS.    REVIEW OF SYSTEMS: Constitutional: No fevers, chills, sweats, or change in appetite.   She has fatigue and poor sleep. She has restless leg syndrome. Eyes: No visual changes, double vision, eye pain Ear, nose and throat: No hearing loss, ear pain, nasal congestion, sore throat Cardiovascular: No chest pain, palpitations Respiratory: No shortness of breath at rest or with exertion.   No wheezes GastrointestinaI: No nausea, vomiting, diarrhea, abdominal pain, fecal incontinence Genitourinary: as above. Musculoskeletal: Reports some muscle and joint pain Integumentary: No rash, pruritus, skin lesions Neurological: as above Psychiatric: Notes depression and anxiety Endocrine: No palpitations, diaphoresis, change in appetite, change in weigh or increased thirst Hematologic/Lymphatic: No anemia, purpura, petechiae. Allergic/Immunologic: No itchy/runny eyes, nasal congestion, recent allergic reactions, rashes  ALLERGIES: Allergies  Allergen Reactions  . Etanercept Anaphylaxis  . Interferon Beta-1a Anaphylaxis  . Betadine [Povidone Iodine]   . Shellfish Allergy   . Amoxicillin Rash  . Iohexol Rash     Desc: ANAPHYLACTIC REACTION TO IODINE  Desc: ANAPHYLACTIC REACTION TO IODINE     HOME MEDICATIONS:  Current outpatient prescriptions:  .  ALPRAZolam (XANAX XR) 0.5 MG 24 hr tablet, Take 0.5 mg by mouth as needed for anxiety. Reported on 02/18/2015, Disp: , Rfl:  .  aluminum hydroxide-magnesium carbonate (GAVISCON) 95-358 MG/15ML SUSP, Take by mouth as needed., Disp:  , Rfl:  .  amphetamine-dextroamphetamine (ADDERALL XR) 30 MG 24 hr capsule, Take 30 mg by mouth 2 (two) times daily., Disp: , Rfl:  .  Ascorbic Acid (VITAMIN C) 1000 MG tablet, Take 1,000 mg by mouth daily., Disp: , Rfl:  .  aspirin 81 MG tablet, Take 81 mg by mouth daily., Disp: , Rfl:  .  Calcium Carbonate-Vitamin D (CALCIUM + D PO), Take 1,200 mg by mouth daily., Disp: , Rfl:  .  cetirizine (ZYRTEC) 10 MG tablet, Take 10 mg by mouth daily., Disp: , Rfl:  .  Coenzyme Q10 (CO Q 10) 10 MG CAPS, Take 10 mg by mouth daily., Disp: , Rfl:  .  Cyanocobalamin (VITAMIN B-12 IJ), Inject as directed every 30 (thirty) days., Disp: , Rfl:  .  EPINEPHrine 0.3 mg/0.3 mL IJ SOAJ injection, Inject 0.3 mg into the muscle once., Disp: , Rfl:  .  escitalopram (LEXAPRO) 10 MG tablet, Take 1 tablet (10 mg  total) by mouth daily., Disp: 30 tablet, Rfl: 11 .  furosemide (LASIX) 20 MG tablet, Take 20 mg by mouth as needed., Disp: , Rfl:  .  ibuprofen (ADVIL,MOTRIN) 800 MG tablet, Take 800 mg by mouth every 8 (eight) hours as needed., Disp: , Rfl:  .  lactase (LACTAID) 3000 UNITS tablet, Take by mouth 3 (three) times daily with meals., Disp: , Rfl:  .  meclizine (ANTIVERT) 25 MG tablet, Take 25 mg by mouth 3 (three) times daily as needed for dizziness., Disp: , Rfl:  .  nadolol (CORGARD) 20 MG tablet, Take 1 tablet (20 mg total) by mouth daily., Disp: 30 tablet, Rfl: 3 .  potassium chloride SA (K-DUR,KLOR-CON) 20 MEQ tablet, , Disp: , Rfl:  .  Probiotic Product (ALIGN PO), Take by mouth., Disp: , Rfl:  .  rOPINIRole (REQUIP) 0.25 MG tablet, Take 0.25 mg by mouth as needed., Disp: , Rfl:  .  SOYA LECITHIN PO, Take 1,200 mg by mouth daily., Disp: , Rfl:  .  temazepam (RESTORIL) 15 MG capsule, Take 15 mg by mouth at bedtime as needed for sleep. Reported on 02/18/2015, Disp: , Rfl:  .  topiramate (TOPAMAX) 200 MG tablet, Take 200 mg by mouth 2 (two) times daily., Disp: , Rfl:  .  UBIQUINOL PO, Take by mouth., Disp: , Rfl:    .  VITAMIN E PO, Take 800 Units by mouth daily., Disp: , Rfl:  .  zolmitriptan (ZOMIG) 5 MG tablet, Take 5 mg by mouth as needed for migraine., Disp: , Rfl:  .  Flax OIL, Take 1,000 mg by mouth daily. Reported on 04/16/2015, Disp: , Rfl:  .  gabapentin (NEURONTIN) 600 MG tablet, Take 600 mg by mouth 3 (three) times daily as needed. Reported on 04/16/2015, Disp: , Rfl:  .  omeprazole (PRILOSEC) 20 MG capsule, Take 20 mg by mouth daily. Reported on 04/16/2015, Disp: , Rfl:  .  polyethylene glycol-electrolytes (NULYTELY/GOLYTELY) 420 G solution, Take 4,000 mLs by mouth once., Disp: 4000 mL, Rfl: 0 .  scopolamine (TRANSDERM-SCOP) 1 MG/3DAYS, Place 1 patch onto the skin as needed. Reported on 04/16/2015, Disp: , Rfl:  .  solifenacin (VESICARE) 10 MG tablet, Take 1 tablet (10 mg total) by mouth daily. (Patient not taking: Reported on 04/16/2015), Disp: 30 tablet, Rfl: 11  PAST MEDICAL HISTORY: Past Medical History  Diagnosis Date  . Cancer ([place])   . Collagen vascular disease ([place])   . Multiple sclerosis ([place])   . Lumbar disc herniation     L1-L2; L4-L5  . Spinal stenosis   . DDD (degenerative disc disease), cervical   . Rheumatoid arthritis ([place])     right hand  . Carpal tunnel syndrome     right hand  . Tenosynovitis   . Heart murmur   . Diverticulitis   . Migraines   . Optic neuritis   . Neuropathy ([place])   . GERD (gastroesophageal reflux disease)   . Depression   . Hypercholesterolemia   . Pernicious anemia     PAST SURGICAL HISTORY: Past Surgical History  Procedure Laterality Date  . Cardiac catheterization  06/13/06  . Tenosynovectomy Bilateral 1985  . Total abdominal hysterectomy  06/07/04  . Appendectomy  06/07/04  . Tonsilectomy, adenoidectomy, bilateral myringotomy and tubes      as a child  . Nasal fracture surgery      as a child  . Cholecystectomy  1989    FAMILY HISTORY: Family History  Problem Relation Age of Onset  .  Colon cancer Neg Hx     SOCIAL  HISTORY:  Social History   Social History  . Marital Status: Widowed    Spouse Name: N/A  . Number of Children: N/A  . Years of Education: N/A   Occupational History  . Not on file.   Social History Main Topics  . Smoking status: Never Smoker   . Smokeless tobacco: Not on file  . Alcohol Use: No  . Drug Use: No  . Sexual Activity: Not on file   Other Topics Concern  . Not on file   Social History Narrative     PHYSICAL EXAM  Filed Vitals:   04/16/15 1506  BP: 164/92  Pulse: 74  Resp: 18  Height: 5\' 6"  (1.676 m)  Weight: 171 lb 6.4 oz (77.747 kg)    Body mass index is 27.68 kg/(m^2).   General: The patient is well-developed and well-nourished and in no acute distress  Neck: The neck is supple.   The neck is nontender.  Skin: Extremities are without rash.   Seh has mild pedal/ankle edema.  Musculoskeletal:  Back is nontender.   Nodules on hand and toe joints.  Neurologic Exam  Mental status: The patient is alert and oriented x 3 at the time of the examination. The patient has apparent normal recent and remote memory, with an apparently normal attention span and concentration ability.   Speech is normal.  Cranial nerves: Extraocular movements are full.  Facial symmetry is present. There is good facial sensation to soft touch bilaterally.Facial strength is normal.  Trapezius and sternocleidomastoid strength is normal. No dysarthria is noted.  The tongue is midline, and the patient has symmetric elevation of the soft palate. No obvious hearing deficits are noted.  Motor:  7 hz low amplitude tremor increases when trying to hold still.    Muscle bulk is normal.   Tone is slightly increased in legs, worse on the left. Strength is  5 / 5 in all 4 extremities.   Sensory: Sensory testing is intact to vibration sensation in all 4 extremities, though she reports mild asymmetry with touch sensation in the arms.  Coordination: Cerebellar testing reveals good  finger-nose-finger and mildly reduced left heel-to-shin bilaterally.  Gait and station: Station is normal.   Gait shows left foot drop. Tandem gait is wide. Romberg is negative.   Reflexes: Deep tendon reflexes are increased in legs, left > right.   Plantar responses are flexor.    DIAGNOSTIC DATA (LABS, IMAGING, TESTING) - I reviewed patient records, labs, notes, testing and imaging myself where available.  Lab Results  Component Value Date   WBC 5.3 02/18/2015   HGB 11.7* 01/06/2015   HCT 36.1 02/18/2015   MCV 91 02/18/2015   PLT 291 01/06/2015       ASSESSMENT AND PLAN  Multiple sclerosis (HCC)  Gait disturbance  High risk medication use  Other fatigue  Other urinary incontinence  Depression with anxiety   1.   Her MRI has not changed between 2008 and a couple months ago.   She prefers to not be on any disease modifying therapy. As she appears stable and plaque burden is mild that is not unreasonable.  I did discuss with her that we would want to recheck an MRI every year or 2 to make sure that there is subclinical progression. 2.   We discussed that some of her symptoms may be coming from her moderate spinal stenosis, especially the bladder issues and possibly the slightly  impaired gait. She has an appointment to see Dr. Patrice Paradise soon.  3.   Inderal LA for essential tremor.     4.   She will continue her other symptomatic medications. 5.   Return in 6 months or sooner if there are new or worsening neurologic symptoms.  45 minute face-to-face evaluation with greater than one half of the time counseling and coordinating care about her symptoms and MRI changes.  Cecile Gillispie A. Felecia Shelling, MD, PhD [id number], [id number] PM Certified in Neurology, Clinical Neurophysiology, Sleep Medicine, Pain Medicine and Neuroimaging  [facility] [address] [place], [place] [address] [phone]

## 2015-04-28 ENCOUNTER — Encounter: Payer: Self-pay | Admitting: Gastroenterology

## 2015-04-28 ENCOUNTER — Ambulatory Visit (INDEPENDENT_AMBULATORY_CARE_PROVIDER_SITE_OTHER): Payer: PPO | Admitting: Gastroenterology

## 2015-04-28 VITALS — BP 114/70 | HR 68 | Ht 66.0 in | Wt 168.5 lb

## 2015-04-28 DIAGNOSIS — K623 Rectal prolapse: Secondary | ICD-10-CM

## 2015-04-28 DIAGNOSIS — R194 Change in bowel habit: Secondary | ICD-10-CM

## 2015-04-28 DIAGNOSIS — K921 Melena: Secondary | ICD-10-CM

## 2015-04-28 NOTE — Patient Instructions (Signed)
You have been scheduled for a flexible sigmoidoscopy. Please follow written instructions given to you at your visit today.  Please pick up your prep supplies at the pharmacy within the next 1-3 days. If you use inhalers (even only as needed), please bring them with you on the day of your procedure.

## 2015-04-28 NOTE — Progress Notes (Signed)
HPI :  50 y/o female seen in consultation with symptoms concerning for rectal prolapse per patient. This is her first time to this clinic.   She has a history of MS. 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 One day she felt a "pop" in her abdomen with diffuse bruising in her abdomen, this was a year ago or so. She reports she had soreness in her abdomen for a while after this injury. She has had symptoms of abdominal bloating / distension after she eats since this issue although her abdominal tenderness has since resolved. She is concerned she has a parasite in her stools. She reports she gave a stool sample to her veternarian and told her she had "larvae" in her stools. She has had "stool cultures" and ova/parasites and other stool testing which have not shown any parasites noted on follow up testing. She states is convinced has a parasite infection causing her symptoms and is frustrated she has not had therapy for this issue yet.   She states her main complaint today is for evaluation of "prolapsing tissue" from her rectum and that she has seen blood in her stools. She reports with BMs, if she has to strain she will have tissue prolapse out of her, or if she lifts something heavy, "an inch of tissue or so" will prolapse, sometimes this is painful. She reports it can spontaneously reduce and it sometimes has to be manually reduced. She endorses a sense of incomplete evacuation as well , and uses manual maneuvers to help evacuate her stools. She does not take anything for her bowels. At this time she has only rare blood in the stools. She has prolapse 2-3 times per week, usually with heavy lifting.   She has declined a prior colonoscopy. No colon cancer in the family.    Past Medical History  Diagnosis Date  . Collagen vascular disease ([place])   . Multiple sclerosis ([place])   . Lumbar disc herniation     L1-L2; L4-L5  . Spinal stenosis   . DDD (degenerative disc disease), cervical   . Rheumatoid arthritis ([place])     right hand  . Carpal tunnel syndrome     right hand  . Tenosynovitis   . Heart murmur   . Migraines   . Optic neuritis   . Neuropathy ([place])   . GERD (gastroesophageal reflux disease)   . Depression   . Hypercholesterolemia   . Pernicious anemia      Past Surgical History  Procedure Laterality Date  . Cardiac catheterization  06/13/06  . Tenosynovectomy Bilateral 1985  . Total abdominal hysterectomy  06/07/04  . Appendectomy  06/07/04  . Tonsilectomy, adenoidectomy, bilateral myringotomy and tubes      as a child  . Nasal fracture surgery      as a child  . Cholecystectomy  1989   Family History  Problem Relation Age of Onset  . Colon cancer Neg Hx    Social History  Substance Use Topics  . Smoking status: Never Smoker   . Smokeless tobacco: Not on file  . Alcohol Use: No   Current Outpatient Prescriptions  Medication Sig Dispense Refill  . ALPRAZolam (XANAX XR) 0.5 MG 24 hr tablet Take 0.5 mg by mouth as needed for anxiety. Reported on 04/28/2015    . aluminum hydroxide-magnesium carbonate (GAVISCON) 95-358 MG/15ML SUSP Take by mouth as needed.    Marland Kitchen  amphetamine-dextroamphetamine (ADDERALL XR) 30 MG 24 hr capsule Take 30 mg by mouth 2 (two) times daily.    . Ascorbic Acid (VITAMIN C) 1000 MG tablet Take 1,000 mg by mouth daily.    Marland Kitchen aspirin 81 MG tablet Take 81 mg by mouth daily.    . Calcium Carbonate-Vitamin D (CALCIUM + D PO) Take 1,200 mg by mouth daily.    . cetirizine (ZYRTEC) 10 MG tablet Take 10 mg by mouth daily.    . Cyanocobalamin (VITAMIN B-12 IJ) Inject as directed every 30 (thirty) days.    Marland Kitchen EPINEPHrine 0.3 mg/0.3 mL IJ SOAJ injection Inject 0.3 mg into the muscle once.    . escitalopram (LEXAPRO) 10 MG tablet Take 1 tablet (10 mg total) by mouth daily. 30 tablet 11  . Flax OIL Take 1,000 mg by mouth daily. Reported on 04/16/2015    .  furosemide (LASIX) 20 MG tablet Take 20 mg by mouth as needed.    . gabapentin (NEURONTIN) 600 MG tablet Take 600 mg by mouth 3 (three) times daily as needed. Reported on 04/16/2015    . ibuprofen (ADVIL,MOTRIN) 800 MG tablet Take 800 mg by mouth every 8 (eight) hours as needed.    . lactase (LACTAID) 3000 UNITS tablet Take by mouth 3 (three) times daily with meals.    . meclizine (ANTIVERT) 25 MG tablet Take 25 mg by mouth 3 (three) times daily as needed for dizziness.    . potassium chloride SA (K-DUR,KLOR-CON) 20 MEQ tablet     . Probiotic Product (ALIGN PO) Take by mouth.    . propranolol ER (INDERAL LA) 60 MG 24 hr capsule Take 1 capsule (60 mg total) by mouth daily. 30 capsule 5  . rOPINIRole (REQUIP) 0.25 MG tablet Take 0.25 mg by mouth as needed.    Marland Kitchen SOYA LECITHIN PO Take 1,200 mg by mouth daily.    . temazepam (RESTORIL) 15 MG capsule Take 15 mg by mouth at bedtime as needed for sleep. Reported on 02/18/2015    . topiramate (TOPAMAX) 200 MG tablet Take 200 mg by mouth 2 (two) times daily.    Marland Kitchen UBIQUINOL PO Take by mouth.    . zolmitriptan (ZOMIG) 5 MG tablet Take 5 mg by mouth as needed for migraine.     No current facility-administered medications for this visit.   Allergies  Allergen Reactions  . Etanercept Anaphylaxis  . Interferon Beta-1a Anaphylaxis  . Betadine [Povidone Iodine]   . Shellfish Allergy   . Amoxicillin Rash  . Iohexol Rash     Desc: ANAPHYLACTIC REACTION TO IODINE  Desc: ANAPHYLACTIC REACTION TO IODINE      Review of Systems: All systems reviewed and negative except where noted in HPI.   Lab Results  Component Value Date   WBC 5.3 02/18/2015   HGB 11.7* 01/06/2015   HCT 36.1 02/18/2015   MCV 91 02/18/2015   PLT 291 01/06/2015    No results found for: CREATININE, BUN, NA, K, CL, CO2  Lab Results  Component Value Date   ALT 19 02/18/2015   AST 24 02/18/2015   ALKPHOS 112 02/18/2015   BILITOT 0.4 02/18/2015    GI pathogen panel negative  12/2014  Physical Exam: BP 114/70 mmHg  Pulse 68  Ht 5\' 6"  (1.676 m)  Wt 168 lb 8 oz (76.431 kg)  BMI 27.21 kg/m2 Constitutional: Pleasant,well-developed, female in no acute distress. HEENT: Normocephalic and atraumatic. Conjunctivae are normal. No scleral icterus. Neck supple.  Cardiovascular: Normal rate,  regular rhythm.  Pulmonary/chest: Effort normal and breath sounds normal. No wheezing, rales or rhonchi. Abdominal: Soft, nondistended, nontender. Bowel sounds active throughout. There are no masses palpable. No hepatomegaly. Rectal - no obvious prolapse with valsalva, no external abnormalities such as fissure or hemorrhoids. No mass lesions palpated on DRE Anoscopy shows internal hemorrhoids otherwise normal without mass lesion.  Extremities: no edema Lymphadenopathy: No cervical adenopathy noted. Neurological: Alert and oriented to person place and time. Skin: Skin is warm and dry. No rashes noted. Psychiatric: Normal mood and affect. Behavior is normal.   ASSESSMENT AND PLAN: 50 y/o female here for evaluation for suspected rectal prolapse and her suspicion of a parasitic infection of her bowel. She also has chronic blood in the stools and constipation.   Her anoscopy today shows internal hemorrhoids and no mass palpated on DRE. She could not reproduce a prolapse with valsalva. She shows me pictures of her "prolapsed tissue" taken with her phone as she describes it, and it does appear to be concerning for rectal prolapse or less likely prolapsed hemorrhoids. Given her rectal bleeding I have recommend a colonoscopy to ensure no bleeding polyp or mass lesion in the rectum and to perform her CRC screening. After a discussion of optical colonoscopy she adamantly declined colonoscopy, and states she would never want to have this done, as if she had colon cancer she would "not want anything done anyway". I explained to her the goal of the procedure was to prevent colon cancer by removing  polyps, detect colon cancer if she had it hopefully at an early stage where it is amenable to treatment, and to evaluate her symptoms of rectal bleeding and prolapse. She again declined colonoscopy after this discussion. She was, however, amenable to a flexible sigmoidoscopy to evaluate her symptoms. I discussed the risks /benefits of this and she wished to proceed with it. If the exam shows only small hemorrhoids or otherwise normal, I will refer her to surgery to evaluated for suspected rectal prolapse. If she has large hemorrhoids which appear to be prolapsing, we could otherwise consider banding, but don't think this will be the base based on anoscopy. I recommended she take a daily fiber supplement in the interim to soften her stools and prevent straining.   I otherwise offered her further stool testing with ova/parasite to evaluate her concern for a parasitic infection. She wanted empiric therapy for it with "antiparasitic medication". I asked her to provide the report with the specific organism detected from prior stool testing as diagnosed by her vet I would consider it, but offered her a stool test and counseled her that if she had an active parasitic infection that warranted therapy we would see it on a repeat stool study. She declined further stool testing.   I will see her for her procedure, further recommendations pending the result.   Amsterdam Cellar, MD [facility] Pager [phone]

## 2015-04-30 DIAGNOSIS — M5080 Other cervical disc disorders, unspecified cervical region: Secondary | ICD-10-CM | POA: Diagnosis not present

## 2015-04-30 DIAGNOSIS — M47812 Spondylosis without myelopathy or radiculopathy, cervical region: Secondary | ICD-10-CM | POA: Diagnosis not present

## 2015-04-30 DIAGNOSIS — M542 Cervicalgia: Secondary | ICD-10-CM | POA: Diagnosis not present

## 2015-04-30 DIAGNOSIS — M4802 Spinal stenosis, cervical region: Secondary | ICD-10-CM | POA: Diagnosis not present

## 2015-05-14 ENCOUNTER — Telehealth: Payer: Self-pay | Admitting: Gastroenterology

## 2015-05-14 NOTE — Telephone Encounter (Signed)
Returned patient's call and no answer.  I left a message for her to call me at [phone].

## 2015-05-15 ENCOUNTER — Encounter: Payer: Self-pay | Admitting: Gastroenterology

## 2015-05-15 ENCOUNTER — Ambulatory Visit (AMBULATORY_SURGERY_CENTER): Payer: PPO | Admitting: Gastroenterology

## 2015-05-15 VITALS — BP 139/81 | HR 68 | Temp 97.5°F | Resp 12 | Ht 66.0 in | Wt 168.0 lb

## 2015-05-15 DIAGNOSIS — K921 Melena: Secondary | ICD-10-CM | POA: Diagnosis not present

## 2015-05-15 DIAGNOSIS — K623 Rectal prolapse: Secondary | ICD-10-CM | POA: Diagnosis not present

## 2015-05-15 MED ORDER — SODIUM CHLORIDE 0.9 % IV SOLN: 500.0000 mL | INTRAVENOUS | Status: DC

## 2015-05-15 NOTE — Op Note (Signed)
[facility] Patient Name: Nancy Byrd Procedure Date: 05/15/2015 1:53 PM MRN: [id number] Endoscopist: Remo Lipps P. Havery Moros , MD Age: 50 Referring MD:  Date of Birth: 05-15-1965 Gender: Female Procedure:                Flexible Sigmoidoscopy Indications:              suspected rectal prolapse Medicines:                Monitored Anesthesia Care Procedure:                Pre-Anesthesia Assessment:                           - Prior to the procedure, a History and Physical                            was performed, and patient medications and                            allergies were reviewed. The patient's tolerance of                            previous anesthesia was also reviewed. The risks                            and benefits of the procedure and the sedation                            options and risks were discussed with the patient.                            All questions were answered, and informed consent                            was obtained. Prior Anticoagulants: The patient has                            taken aspirin, last dose was 1 day prior to                            procedure. ASA Grade Assessment: II - A patient                            with mild systemic disease. After reviewing the                            risks and benefits, the patient was deemed in                            satisfactory condition to undergo the procedure.                           After obtaining informed consent, the scope was  passed under direct vision. The Model PCF-H190L                            [phone]) scope was introduced through the anus                            and advanced to the the sigmoid colon. The flexible                            sigmoidoscopy was accomplished without difficulty.                            The patient tolerated the procedure well. The                            quality of the bowel preparation was  fair. Scope In: Scope Out: Findings:      The perianal and digital rectal examinations were normal.      The rectum, recto-sigmoid colon and distal sigmoid colon appeared normal.      Non-bleeding internal hemorrhoids were found during retroflexion. The       hemorrhoids were small. Complications:            No immediate complications. Estimated blood loss:                            None. Estimated Blood Loss:     Estimated blood loss: none. Impression:               - Preparation of the colon was fair.                           - The rectum, recto-sigmoid colon and distal                            sigmoid colon are normal. No obvious colon polyps                            or mass lesions noted up to the examined very                            distal sigmoid colon.                           - Non-bleeding internal hemorrhoids.                           - I suspect the patient is having rectal prolapse                            causing her symptoms based on clinical history, no                            pathology noted to cause symptoms otherwise. Recommendation:           - Patient has a contact number available for  emergencies. The signs and symptoms of potential                            delayed complications were discussed with the                            patient. Return to normal activities tomorrow.                            Written discharge instructions were provided to the                            patient.                           - Resume previous diet.                           - Continue present medications.                           - Refer to a surgeon for further evaluation for                            possible rectal prolapse. Procedure Code(s):        --- Professional ---                           [phone], Sigmoidoscopy, flexible; diagnostic,                            including collection of specimen(s) by brushing or                             washing, when performed (separate procedure) CPT copyright 2016 American Medical Association. All rights reserved. Remo Lipps P. Havery Moros, MD Nancy Byrd. Nancy Ditullio, MD 05/15/2015 2:38:41 PM This report has been signed electronically. Number of Addenda: 0

## 2015-05-15 NOTE — Progress Notes (Signed)
0a and O x 3  Report to [employer]

## 2015-05-15 NOTE — Progress Notes (Addendum)
No IV sticks tops of hands per pt. Due to tenosynovectomy bilateral No egg or soy allergy known to patient  No issues with past sedation with any surgeries  or procedures, no intubation problems  No diet pills per patient No home 02 use per patient  No blood thinners per patient  Pt denies issues with constipation

## 2015-05-15 NOTE — Telephone Encounter (Signed)
Pt states she was called yesterday and told to be here at 130.  She was moved to 130 pm so was to be here at 1230. Called pt 1135 and she states she is an hour away and she has drank her mag citrite but has not done her enema and she has an hour drive to [place].  Spoke to [place] and since [facility] has no 230pm case okay for her to be here at 130p for a 230 pm flex.  Pt instructed to go ahead and do her enema now since she has to leave by 1230 pm to get  here on time.  Pt verbalized understanding. Beth McKew LPN notified and will explain to Dr Havery Moros.  Lelan Pons PV

## 2015-05-15 NOTE — Patient Instructions (Signed)
Impressions/recommendations:  Hemorrhoids (handout given)  Suspected rectal prolapse causing symptoms.  YOU HAD AN ENDOSCOPIC PROCEDURE TODAY AT [facility]:   Refer to the procedure report that was given to you for any specific questions about what was found during the examination.  If the procedure report does not answer your questions, please call your gastroenterologist to clarify.  If you requested that your care partner not be given the details of your procedure findings, then the procedure report has been included in a sealed envelope for you to review at your convenience later.  YOU SHOULD EXPECT: Some feelings of bloating in the abdomen. Passage of more gas than usual.  Walking can help get rid of the air that was put into your GI tract during the procedure and reduce the bloating. If you had a lower endoscopy (such as a colonoscopy or flexible sigmoidoscopy) you may notice spotting of blood in your stool or on the toilet paper. If you underwent a bowel prep for your procedure, you may not have a normal bowel movement for a few days.  Please Note:  You might notice some irritation and congestion in your nose or some drainage.  This is from the oxygen used during your procedure.  There is no need for concern and it should clear up in a day or so.  SYMPTOMS TO REPORT IMMEDIATELY:   Following lower endoscopy (colonoscopy or flexible sigmoidoscopy):  Excessive amounts of blood in the stool  Significant tenderness or worsening of abdominal pains  Swelling of the abdomen that is new, acute  Fever of 100F or higher  For urgent or emergent issues, a gastroenterologist can be reached at any hour by calling [phone].   DIET: Your first meal following the procedure should be a small meal and then it is ok to progress to your normal diet. Heavy or fried foods are harder to digest and may make you feel nauseous or bloated.  Likewise, meals heavy in dairy and vegetables can  increase bloating.  Drink plenty of fluids but you should avoid alcoholic beverages for 24 hours.  ACTIVITY:  You should plan to take it easy for the rest of today and you should NOT DRIVE or use heavy machinery until tomorrow (because of the sedation medicines used during the test).    FOLLOW UP: Our staff will call the number listed on your records the next business day following your procedure to check on you and address any questions or concerns that you may have regarding the information given to you following your procedure. If we do not reach you, we will leave a message.  However, if you are feeling well and you are not experiencing any problems, there is no need to return our call.  We will assume that you have returned to your regular daily activities without incident.  If any biopsies were taken you will be contacted by phone or by letter within the next 1-3 weeks.  Please call us at [phone] if you have not heard about the biopsies in 3 weeks.    SIGNATURES/CONFIDENTIALITY: You and/or your care partner have signed paperwork which will be entered into your electronic medical record.  These signatures attest to the fact that that the information above on your After Visit Summary has been reviewed and is understood.  Full responsibility of the confidentiality of this discharge information lies with you and/or your care-partner.

## 2015-05-18 ENCOUNTER — Telehealth: Payer: Self-pay | Admitting: *Deleted

## 2015-05-18 NOTE — Telephone Encounter (Signed)
Patient given appointment date and time. 

## 2015-05-18 NOTE — Telephone Encounter (Signed)
  Follow up Call-  Call back number 05/15/2015  Post procedure Call Back phone  # [phone]  Permission to leave phone message No  comments AM not working     Patient questions:  Do you have a fever, pain , or abdominal swelling? No. Pain Score  0 *  Have you tolerated food without any problems? Yes.    Have you been able to return to your normal activities? Yes.    Do you have any questions about your discharge instructions: Diet   No. Medications  No. Follow up visit  No.  Do you have questions or concerns about your Care? No.  Actions: * If pain score is 4 or above: No action needed, pain <4.

## 2015-05-18 NOTE — Telephone Encounter (Signed)
Can you assist in placing a consult for Nancy Byrd (MRN [id number]) for surgery evaluation for rectal prolapse.Spoke with [facility] and scheduled patient on 06/08/15 at 2:30 PM with Dr. Marcello Moores. Left a message for patient to call back.

## 2015-05-19 ENCOUNTER — Ambulatory Visit: Payer: Medicare HMO | Admitting: Neurology

## 2015-05-20 ENCOUNTER — Ambulatory Visit: Payer: PPO | Admitting: Neurology

## 2015-05-26 DIAGNOSIS — K623 Rectal prolapse: Secondary | ICD-10-CM | POA: Diagnosis not present

## 2015-05-26 DIAGNOSIS — K219 Gastro-esophageal reflux disease without esophagitis: Secondary | ICD-10-CM | POA: Diagnosis not present

## 2015-05-26 DIAGNOSIS — G35 Multiple sclerosis: Secondary | ICD-10-CM | POA: Diagnosis not present

## 2015-05-26 DIAGNOSIS — M4712 Other spondylosis with myelopathy, cervical region: Secondary | ICD-10-CM | POA: Diagnosis not present

## 2015-05-26 DIAGNOSIS — Z1389 Encounter for screening for other disorder: Secondary | ICD-10-CM | POA: Diagnosis not present

## 2015-05-26 DIAGNOSIS — F331 Major depressive disorder, recurrent, moderate: Secondary | ICD-10-CM | POA: Diagnosis not present

## 2015-05-26 DIAGNOSIS — F900 Attention-deficit hyperactivity disorder, predominantly inattentive type: Secondary | ICD-10-CM | POA: Diagnosis not present

## 2015-05-26 DIAGNOSIS — M797 Fibromyalgia: Secondary | ICD-10-CM | POA: Diagnosis not present

## 2015-06-08 DIAGNOSIS — K623 Rectal prolapse: Secondary | ICD-10-CM | POA: Diagnosis not present

## 2015-06-18 DIAGNOSIS — H6123 Impacted cerumen, bilateral: Secondary | ICD-10-CM | POA: Diagnosis not present

## 2015-06-24 DIAGNOSIS — H5713 Ocular pain, bilateral: Secondary | ICD-10-CM | POA: Diagnosis not present

## 2015-08-06 DIAGNOSIS — Z6826 Body mass index (BMI) 26.0-26.9, adult: Secondary | ICD-10-CM | POA: Diagnosis not present

## 2015-08-06 DIAGNOSIS — M542 Cervicalgia: Secondary | ICD-10-CM | POA: Diagnosis not present

## 2015-08-06 DIAGNOSIS — M4802 Spinal stenosis, cervical region: Secondary | ICD-10-CM | POA: Diagnosis not present

## 2015-08-19 ENCOUNTER — Other Ambulatory Visit: Payer: Self-pay | Admitting: *Deleted

## 2015-08-19 DIAGNOSIS — I83893 Varicose veins of bilateral lower extremities with other complications: Secondary | ICD-10-CM

## 2015-09-21 DIAGNOSIS — M4800 Spinal stenosis, site unspecified: Secondary | ICD-10-CM | POA: Diagnosis not present

## 2015-09-21 DIAGNOSIS — M542 Cervicalgia: Secondary | ICD-10-CM | POA: Diagnosis not present

## 2015-09-21 DIAGNOSIS — G35 Multiple sclerosis: Secondary | ICD-10-CM | POA: Diagnosis not present

## 2015-09-21 DIAGNOSIS — H9202 Otalgia, left ear: Secondary | ICD-10-CM | POA: Diagnosis not present

## 2015-09-28 DIAGNOSIS — M4802 Spinal stenosis, cervical region: Secondary | ICD-10-CM | POA: Diagnosis not present

## 2015-09-28 DIAGNOSIS — Z6826 Body mass index (BMI) 26.0-26.9, adult: Secondary | ICD-10-CM | POA: Diagnosis not present

## 2015-10-07 DIAGNOSIS — M25551 Pain in right hip: Secondary | ICD-10-CM | POA: Diagnosis not present

## 2015-10-09 DIAGNOSIS — M797 Fibromyalgia: Secondary | ICD-10-CM | POA: Diagnosis not present

## 2015-10-09 DIAGNOSIS — M4712 Other spondylosis with myelopathy, cervical region: Secondary | ICD-10-CM | POA: Diagnosis not present

## 2015-10-09 DIAGNOSIS — F900 Attention-deficit hyperactivity disorder, predominantly inattentive type: Secondary | ICD-10-CM | POA: Diagnosis not present

## 2015-10-09 DIAGNOSIS — Z6826 Body mass index (BMI) 26.0-26.9, adult: Secondary | ICD-10-CM | POA: Diagnosis not present

## 2015-10-09 DIAGNOSIS — K219 Gastro-esophageal reflux disease without esophagitis: Secondary | ICD-10-CM | POA: Diagnosis not present

## 2015-10-09 DIAGNOSIS — F331 Major depressive disorder, recurrent, moderate: Secondary | ICD-10-CM | POA: Diagnosis not present

## 2015-10-09 DIAGNOSIS — G35 Multiple sclerosis: Secondary | ICD-10-CM | POA: Diagnosis not present

## 2015-10-09 DIAGNOSIS — K623 Rectal prolapse: Secondary | ICD-10-CM | POA: Diagnosis not present

## 2015-10-12 DIAGNOSIS — M25551 Pain in right hip: Secondary | ICD-10-CM | POA: Diagnosis not present

## 2015-10-13 ENCOUNTER — Other Ambulatory Visit: Payer: Self-pay | Admitting: Neurosurgery

## 2015-10-13 DIAGNOSIS — M4802 Spinal stenosis, cervical region: Secondary | ICD-10-CM

## 2015-10-14 DIAGNOSIS — M25551 Pain in right hip: Secondary | ICD-10-CM | POA: Diagnosis not present

## 2015-10-15 ENCOUNTER — Encounter: Payer: Self-pay | Admitting: Neurology

## 2015-10-15 ENCOUNTER — Ambulatory Visit (INDEPENDENT_AMBULATORY_CARE_PROVIDER_SITE_OTHER): Payer: PPO | Admitting: Neurology

## 2015-10-15 VITALS — BP 144/92 | HR 80 | Resp 14 | Ht 66.0 in | Wt 164.0 lb

## 2015-10-15 DIAGNOSIS — R5383 Other fatigue: Secondary | ICD-10-CM | POA: Diagnosis not present

## 2015-10-15 DIAGNOSIS — N39498 Other specified urinary incontinence: Secondary | ICD-10-CM

## 2015-10-15 DIAGNOSIS — M4802 Spinal stenosis, cervical region: Secondary | ICD-10-CM

## 2015-10-15 DIAGNOSIS — G35 Multiple sclerosis: Secondary | ICD-10-CM

## 2015-10-15 DIAGNOSIS — F418 Other specified anxiety disorders: Secondary | ICD-10-CM | POA: Diagnosis not present

## 2015-10-15 DIAGNOSIS — R269 Unspecified abnormalities of gait and mobility: Secondary | ICD-10-CM

## 2015-10-15 MED ORDER — ROPINIROLE HCL 0.25 MG PO TABS: 0.2500 mg | ORAL_TABLET | ORAL | 5 refills | Status: AC | PRN

## 2015-10-15 MED ORDER — PROPRANOLOL HCL ER 60 MG PO CP24: 60.0000 mg | ORAL_CAPSULE | Freq: Every day | ORAL | 11 refills | Status: DC

## 2015-10-15 MED ORDER — MIRABEGRON ER 50 MG PO TB24: 50.0000 mg | ORAL_TABLET | Freq: Every day | ORAL | 11 refills | Status: DC

## 2015-10-15 MED ORDER — DULOXETINE HCL 60 MG PO CPEP: 60.0000 mg | ORAL_CAPSULE | Freq: Every day | ORAL | 11 refills | Status: DC

## 2015-10-15 NOTE — Progress Notes (Signed)
[facility]  PATIENT: Nancy Byrd DOB: 1965/04/28  REFERRING DOCTOR OR PCP:  Gar Ponto  Fax: [phone] SOURCE: patient, and records and MRI images on PACS  _________________________________   HISTORICAL  CHIEF COMPLAINT:  Chief Complaint  Patient presents with  . Multiple Sclerosis    She is not on any MS med, as mri's have been stable.  She reports depression is worse.  Sts. she fell on 09-26-15--tripped over some rocks--injured right hip and is seeing ortho for this/fim    HISTORY OF PRESENT ILLNESS:  Nancy Byrd is a 50 yo woman with MS.      MRI of the brain January 2017 was unchanged compared to 2008.   MRI cervical spine showed multilevel DJD with moderate spinal stenosis at C5C6 and mild spinal stenosis at C4C5 but no demyelinating lesions or myelopathic signal.     She is currently not on any DMT by choice.  Gait/strength/sensation:   Her gait is unsteady and her right leg drags when she is tired.  She fell 2 weeks ago when she tripped over some rocks next to the [place].    She can walk without a cane but used to use one (or walker at times).  She has spasticity in her legs, right > left.     She has more trouble walking on shiny floors like in stores, compared to carpets.   She denies any significant numbness.   She has had a tremor in her arms the past year.     Cervical stenosis:   She has moderate C5C6 spinal stenosis on her 2017 MRI with a milder spinal stenosis at C4-C5. which could be contributing to her bladder and gait issues.   She saw Dr. Joya Salm in the spring who recommended surgery but hse was hesitiant.   He is setting up an MRI but she hasn't had it yet.   She is now interested   Vision:  She has no major difficulties with her vision. There is no diplopia.  Bladder:   She has severe bladder frequency and occasional incontinence over the past year. This has worsened quite a bit recently.   Oxybutynin and Vesicare had not helped much  and casued her mouth to dry.    She has not tried [employer].  Fatigue/sleep: She feels fatigue and sleepiness are worse.   Initially, she had benefit from Adderall XR 30 mg twice a day but it is helping less now.  She takes at 6 am and then takes a nap every morning and feels better when she wakes up.  She also has insomnia with difficulty falling asleep and staying asleep. Temazepam and gabapentin made her too unsteady at night so she stopped.      She notes restless leg syndrome with some benefit from ropinirole but it makes her sleepy.    Mood/cognition:   She has worse depression this year, despite escitalopram.    She is more irritable and has crying spells.   She also has some anxiety. She also has had difficulties with cognition. She notes poor focus, reduced executive function, verbal fluency and short-term memory.    Tremor:    Tremor is better wit propranolol (nadolol worked better but she got lightheaded)   She reports that she had lightheadedness and low BP when she went on nadolol and she stopped.     Many uncles, aunts and cousins have a similar tremor.     Hip pain:   She has had  more hip pain since her fall.   Hip MRI showed some partial tendon tears and mild trochanteric bursitis.   No fractures on imaging studies.     MS History:    She was diagnosed in 2004 after presenting with dizziness. At that time, she was being treated with Enbrel for her rheumatoid arthritis. She had an MRI and lumbar puncture confirming the diagnosis. She also had several episodes of optic neuritis. She was treated with steroids for her initial symptoms of the optic neuritis. She was placed on Rebif. She was on Rebif for about a year but then stopped due to severe side effects. She reports having an anaphylactic reaction and be hospitalized and having a cardiac workup. She stopped the Rebif and has not gone any disease modifying therapy since that time, around 2007.   She has seen several neurologists including  Dr. Jannifer Franklin, Dr. Jacqulynn Cadet and Dr. Otelia Sergeant [facility]).    She reports that Tysabri was discussed with her but due to potential of PML she opted not to go on the medication. Her last MRI of the brain is from 2009. I personally reviewed the images. She has several T2/FLAIR hyperintense foci including some in the periventricular white matter. The MRIs of the cervical and thoracic spine from 2011 show some degenerative changes but did not show any MS changes.  Repeat brain MRI 03/20/2015 did not show any significant changes. Repeat cervical spine MRI 03/20/2015 to not show any spinal cord lesions. She has mild spinal stenosis at C4-C5 with possible impingement of the left C5 nerve root. She has moderate spinal stenosis at C5-C6 with possible right C6 nerve root impingement. The 5 C6 level have progressed appeared to her previous MRI.   Her dad had MS.    REVIEW OF SYSTEMS: Constitutional: No fevers, chills, sweats, or change in appetite.   She has fatigue and poor sleep. She has restless leg syndrome. Eyes: No visual changes, double vision, eye pain Ear, nose and throat: No hearing loss, ear pain, nasal congestion, sore throat Cardiovascular: No chest pain, palpitations Respiratory: No shortness of breath at rest or with exertion.   No wheezes GastrointestinaI: No nausea, vomiting, diarrhea, abdominal pain, fecal incontinence Genitourinary: as above. Musculoskeletal: Reports some muscle and joint pain Integumentary: No rash, pruritus, skin lesions Neurological: as above Psychiatric: Notes depression and anxiety Endocrine: No palpitations, diaphoresis, change in appetite, change in weigh or increased thirst Hematologic/Lymphatic: No anemia, purpura, petechiae. Allergic/Immunologic: No itchy/runny eyes, nasal congestion, recent allergic reactions, rashes  ALLERGIES: Allergies  Allergen Reactions  . Enbrel [Etanercept] Anaphylaxis  . Interferon Beta-1a Anaphylaxis  . Betadine [Povidone Iodine]     . Other     rebiff  . Shellfish Allergy   . Tdap [Diphth-Acell Pertussis-Tetanus]     Vomiting, nausea, dizzy  . Amoxicillin Rash  . Iohexol Rash     Desc: ANAPHYLACTIC REACTION TO IODINE  Desc: ANAPHYLACTIC REACTION TO IODINE     HOME MEDICATIONS:  Current Outpatient Prescriptions:  .  ALPRAZolam (XANAX XR) 0.5 MG 24 hr tablet, Take 0.5 mg by mouth as needed for anxiety. Reported on 04/28/2015, Disp: , Rfl:  .  aluminum hydroxide-magnesium carbonate (GAVISCON) 95-358 MG/15ML SUSP, Take by mouth as needed., Disp: , Rfl:  .  amphetamine-dextroamphetamine (ADDERALL XR) 30 MG 24 hr capsule, Take 30 mg by mouth daily. , Disp: , Rfl:  .  Ascorbic Acid (VITAMIN C) 1000 MG tablet, Take 1,000 mg by mouth daily., Disp: , Rfl:  .  aspirin 81  MG tablet, Take 81 mg by mouth daily., Disp: , Rfl:  .  Calcium Carbonate-Vitamin D (CALCIUM + D PO), Take 1,200 mg by mouth daily., Disp: , Rfl:  .  cetirizine (ZYRTEC) 10 MG tablet, Take 10 mg by mouth daily., Disp: , Rfl:  .  Cyanocobalamin (VITAMIN B-12 IJ), Inject as directed every 30 (thirty) days., Disp: , Rfl:  .  EPINEPHrine 0.3 mg/0.3 mL IJ SOAJ injection, Inject 0.3 mg into the muscle once., Disp: , Rfl:  .  Flax OIL, Take 1,000 mg by mouth daily. Reported on 04/16/2015, Disp: , Rfl:  .  furosemide (LASIX) 20 MG tablet, Take 20 mg by mouth as needed., Disp: , Rfl:  .  ibuprofen (ADVIL,MOTRIN) 800 MG tablet, Take 800 mg by mouth every 8 (eight) hours as needed., Disp: , Rfl:  .  lactase (LACTAID) 3000 UNITS tablet, Take by mouth 3 (three) times daily with meals., Disp: , Rfl:  .  meclizine (ANTIVERT) 25 MG tablet, Take 25 mg by mouth 3 (three) times daily as needed for dizziness., Disp: , Rfl:  .  potassium chloride SA (K-DUR,KLOR-CON) 20 MEQ tablet, , Disp: , Rfl:  .  Probiotic Product (ALIGN PO), Take by mouth., Disp: , Rfl:  .  propranolol ER (INDERAL LA) 60 MG 24 hr capsule, Take 1 capsule (60 mg total) by mouth daily., Disp: 30 capsule, Rfl:  11 .  rOPINIRole (REQUIP) 0.25 MG tablet, Take 1 tablet (0.25 mg total) by mouth as needed., Disp: 90 tablet, Rfl: 5 .  SOYA LECITHIN PO, Take 1,200 mg by mouth daily., Disp: , Rfl:  .  temazepam (RESTORIL) 15 MG capsule, Take 15 mg by mouth at bedtime as needed for sleep. Reported on 02/18/2015, Disp: , Rfl:  .  topiramate (TOPAMAX) 200 MG tablet, Take 200 mg by mouth 2 (two) times daily., Disp: , Rfl:  .  UBIQUINOL PO, Take by mouth., Disp: , Rfl:  .  zolmitriptan (ZOMIG) 5 MG tablet, Take 5 mg by mouth as needed for migraine., Disp: , Rfl:  .  DULoxetine (CYMBALTA) 60 MG capsule, Take 1 capsule (60 mg total) by mouth daily., Disp: 30 capsule, Rfl: 11 .  gabapentin (NEURONTIN) 600 MG tablet, Take 600 mg by mouth 3 (three) times daily as needed. Reported on 04/16/2015, Disp: , Rfl:  .  mirabegron ER (MYRBETRIQ) 50 MG TB24 tablet, Take 1 tablet (50 mg total) by mouth daily., Disp: 30 tablet, Rfl: 11  PAST MEDICAL HISTORY: Past Medical History:  Diagnosis Date  . Carpal tunnel syndrome    right hand  . Collagen vascular disease ([place])   . DDD (degenerative disc disease), cervical   . Depression   . GERD (gastroesophageal reflux disease)   . Heart murmur   . Hypercholesterolemia   . Lumbar disc herniation    L1-L2; L4-L5  . Migraines   . Multiple sclerosis ([place])   . Neuropathy ([place])   . Optic neuritis   . Pernicious anemia   . Rheumatoid arthritis ([place])    right hand  . Spinal stenosis   . Tenosynovitis     PAST SURGICAL HISTORY: Past Surgical History:  Procedure Laterality Date  . APPENDECTOMY  06/07/04  . CARDIAC CATHETERIZATION  06/13/06  . CHOLECYSTECTOMY  1989  . NASAL FRACTURE SURGERY     as a child  . TENOSYNOVECTOMY Bilateral 1985  . TONSILECTOMY, ADENOIDECTOMY, BILATERAL MYRINGOTOMY AND TUBES     as a child  . TOTAL ABDOMINAL HYSTERECTOMY  06/07/04  FAMILY HISTORY: Family History  Problem Relation Age of Onset  . Colon cancer Neg Hx   . Colon polyps Neg Hx      SOCIAL HISTORY:  Social History   Social History  . Marital status: Widowed    Spouse name: N/A  . Number of children: N/A  . Years of education: N/A   Occupational History  . Not on file.   Social History Main Topics  . Smoking status: Never Smoker  . Smokeless tobacco: Never Used  . Alcohol use No  . Drug use: No  . Sexual activity: Not on file   Other Topics Concern  . Not on file   Social History Narrative  . No narrative on file     PHYSICAL EXAM  Vitals:   10/15/15 1122  BP: (!) 144/92  Pulse: 80  Resp: 14  Weight: 164 lb (74.4 kg)  Height: 5\' 6"  (1.676 m)    Body mass index is 26.47 kg/m.   General: The patient is well-developed and well-nourished and in no acute distress  Neck: The neck is supple.   The neck is nontender.  Musculoskeletal:  Back is nontender.  No trochanteric bursa tenderness on exam.  Nodules on hand and toe joints.  Neurologic Exam  Mental status: The patient is alert and oriented x 3 at the time of the examination. The patient has apparent normal recent and remote memory, with an apparently normal attention span and concentration ability.   Speech is normal.  Cranial nerves: Extraocular movements are full.  There is good facial sensation to soft touch bilaterally.Facial strength is normal.  Trapezius and sternocleidomastoid strength is normal. No dysarthria is noted.  The tongue is midline, and the patient has symmetric elevation of the soft palate. No obvious hearing deficits are noted.  Motor:  6-7 hz low amplitude tremor in hands and head.   Hand tremor increases when trying to hold still.    Muscle bulk is normal.   Tone is slightly increased in legs, worse on the left. Strength is  5 / 5 in all 4 extremities.   Sensory: Sensory testing is intact to vibration sensation in all 4 extremities, though she reports mild asymmetry with touch sensation in the arms.  Coordination: Cerebellar testing reveals good  finger-nose-finger and mildly reduced left heel-to-shin bilaterally.  Gait and station: Station is normal.   Gait shows left foot drop. Tandem gait is wide. Romberg is negative.   Reflexes: Deep tendon reflexes are increased in legs, left > right.       DIAGNOSTIC DATA (LABS, IMAGING, TESTING) - I reviewed patient records, labs, notes, testing and imaging myself where available.  Lab Results  Component Value Date   WBC 5.3 02/18/2015   HGB 11.7 (L) 01/06/2015   HCT 36.1 02/18/2015   MCV 91 02/18/2015   PLT 291 01/06/2015       ASSESSMENT AND PLAN  Multiple sclerosis (HCC)  Gait disturbance  Depression with anxiety  Other urinary incontinence  Other fatigue  Spinal stenosis in cervical region   1.   She will have another MRI with Dr. Hayes Ludwig and see him back shortly to decide on surgery. He has told her that she will likely need a C3-C6 fusion. 2.    Her brain MRI was stable and she plans on remaining off of a disease modifying therapy for her MS. 3.   Inderal LA for essential tremor.     4.   Change the citalopram to  Cymbalta 60 mg. If depression worsens, consider referral to psychiatry.  5.   She will continue her other symptomatic medications. 6.   Return in 6 months or sooner if there are new or worsening neurologic symptoms.   45 minute face-to-face evaluation with greater than one half of the time counseling and coordinating care about her symptoms and MRI changes.  Zaeden Lastinger A. Felecia Shelling, MD, PhD [id number], [id number] PM Certified in Neurology, Clinical Neurophysiology, Sleep Medicine, Pain Medicine and Neuroimaging  [facility] [address], [place], [place] [address] [phone]

## 2015-10-19 ENCOUNTER — Encounter: Payer: Self-pay | Admitting: Vascular Surgery

## 2015-10-20 ENCOUNTER — Ambulatory Visit (HOSPITAL_COMMUNITY)
Admission: RE | Admit: 2015-10-20 | Discharge: 2015-10-20 | Disposition: A | Discharge status: Home / Self Care | Payer: PPO | Source: Ambulatory Visit | Attending: Vascular Surgery | Admitting: Vascular Surgery

## 2015-10-20 ENCOUNTER — Ambulatory Visit (INDEPENDENT_AMBULATORY_CARE_PROVIDER_SITE_OTHER): Payer: PPO | Admitting: Vascular Surgery

## 2015-10-20 ENCOUNTER — Encounter: Payer: Self-pay | Admitting: Vascular Surgery

## 2015-10-20 VITALS — BP 130/70 | HR 62 | Temp 97.9°F | Resp 14 | Ht 66.0 in | Wt 163.0 lb

## 2015-10-20 DIAGNOSIS — I83893 Varicose veins of bilateral lower extremities with other complications: Secondary | ICD-10-CM | POA: Diagnosis not present

## 2015-10-20 DIAGNOSIS — K219 Gastro-esophageal reflux disease without esophagitis: Secondary | ICD-10-CM | POA: Diagnosis not present

## 2015-10-20 DIAGNOSIS — R609 Edema, unspecified: Secondary | ICD-10-CM | POA: Diagnosis not present

## 2015-10-20 DIAGNOSIS — E7800 Pure hypercholesterolemia, unspecified: Secondary | ICD-10-CM | POA: Diagnosis not present

## 2015-10-20 DIAGNOSIS — F329 Major depressive disorder, single episode, unspecified: Secondary | ICD-10-CM | POA: Diagnosis not present

## 2015-10-20 DIAGNOSIS — M79605 Pain in left leg: Secondary | ICD-10-CM | POA: Diagnosis not present

## 2015-10-20 NOTE — Progress Notes (Signed)
Subjective:     Patient ID: Nancy Byrd, female   DOB: 1965/05/12, 50 y.o.   MRN: [id number]  HPI This 50 year old female was referred by Dr. Gar Ponto for evaluation of possible venous disease. Patient complains of left leg pain particularly in the lateral thigh and lateral calf. She has had multiple procedures performed at [facility] over the past 2 years. She is not exactly certain what procedure she's had. Medical records support that she has had laser ablation on 2 occasions in the right leg. She has no history of DVT thrombophlebitis stasis ulcers or bleeding. She denies any bulging varicosities. She states that the leg has heart in a similar fashion prior to previous procedures. She does not wear elastic compression stockings on a regular basis.  Past Medical History:  Diagnosis Date  . Carpal tunnel syndrome    right hand  . Collagen vascular disease ([place])   . DDD (degenerative disc disease), cervical   . Depression   . GERD (gastroesophageal reflux disease)   . Heart murmur   . Hypercholesterolemia   . Lumbar disc herniation    L1-L2; L4-L5  . Migraines   . Multiple sclerosis ([place])   . Neuropathy ([place])   . Optic neuritis   . Pernicious anemia   . Rheumatoid arthritis ([place])    right hand  . Spinal stenosis   . Tenosynovitis     Social History  Substance Use Topics  . Smoking status: Never Smoker  . Smokeless tobacco: Never Used  . Alcohol use No    Family History  Problem Relation Age of Onset  . Colon cancer Neg Hx   . Colon polyps Neg Hx     Allergies  Allergen Reactions  . Enbrel [Etanercept] Anaphylaxis  . Interferon Beta-1a Anaphylaxis  . Betadine [Povidone Iodine]   . Other     rebiff  . Shellfish Allergy   . Tdap [Diphth-Acell Pertussis-Tetanus]     Vomiting, nausea, dizzy  . Amoxicillin Rash  . Iohexol Rash     Desc: ANAPHYLACTIC REACTION TO IODINE  Desc: ANAPHYLACTIC REACTION TO IODINE      Current Outpatient Prescriptions:  .   aspirin 81 MG tablet, Take 81 mg by mouth daily., Disp: , Rfl:  .  DULoxetine (CYMBALTA) 60 MG capsule, Take 1 capsule (60 mg total) by mouth daily., Disp: 30 capsule, Rfl: 11 .  propranolol ER (INDERAL LA) 60 MG 24 hr capsule, Take 1 capsule (60 mg total) by mouth daily., Disp: 30 capsule, Rfl: 11 .  ALPRAZolam (XANAX XR) 0.5 MG 24 hr tablet, Take 0.5 mg by mouth as needed for anxiety. Reported on 04/28/2015, Disp: , Rfl:  .  aluminum hydroxide-magnesium carbonate (GAVISCON) 95-358 MG/15ML SUSP, Take by mouth as needed., Disp: , Rfl:  .  amphetamine-dextroamphetamine (ADDERALL XR) 30 MG 24 hr capsule, Take 30 mg by mouth daily. , Disp: , Rfl:  .  Ascorbic Acid (VITAMIN C) 1000 MG tablet, Take 1,000 mg by mouth daily., Disp: , Rfl:  .  Calcium Carbonate-Vitamin D (CALCIUM + D PO), Take 1,200 mg by mouth daily., Disp: , Rfl:  .  cetirizine (ZYRTEC) 10 MG tablet, Take 10 mg by mouth daily., Disp: , Rfl:  .  Cyanocobalamin (VITAMIN B-12 IJ), Inject as directed every 30 (thirty) days., Disp: , Rfl:  .  EPINEPHrine 0.3 mg/0.3 mL IJ SOAJ injection, Inject 0.3 mg into the muscle once., Disp: , Rfl:  .  Flax OIL, Take 1,000 mg by mouth daily.  Reported on 04/16/2015, Disp: , Rfl:  .  furosemide (LASIX) 20 MG tablet, Take 20 mg by mouth as needed., Disp: , Rfl:  .  gabapentin (NEURONTIN) 600 MG tablet, Take 600 mg by mouth 3 (three) times daily as needed. Reported on 04/16/2015, Disp: , Rfl:  .  ibuprofen (ADVIL,MOTRIN) 800 MG tablet, Take 800 mg by mouth every 8 (eight) hours as needed., Disp: , Rfl:  .  lactase (LACTAID) 3000 UNITS tablet, Take by mouth 3 (three) times daily with meals., Disp: , Rfl:  .  meclizine (ANTIVERT) 25 MG tablet, Take 25 mg by mouth 3 (three) times daily as needed for dizziness., Disp: , Rfl:  .  mirabegron ER (MYRBETRIQ) 50 MG TB24 tablet, Take 1 tablet (50 mg total) by mouth daily., Disp: 30 tablet, Rfl: 11 .  potassium chloride SA (K-DUR,KLOR-CON) 20 MEQ tablet, , Disp: , Rfl:  .   Probiotic Product (ALIGN PO), Take by mouth., Disp: , Rfl:  .  rOPINIRole (REQUIP) 0.25 MG tablet, Take 1 tablet (0.25 mg total) by mouth as needed., Disp: 90 tablet, Rfl: 5 .  SOYA LECITHIN PO, Take 1,200 mg by mouth daily., Disp: , Rfl:  .  temazepam (RESTORIL) 15 MG capsule, Take 15 mg by mouth at bedtime as needed for sleep. Reported on 02/18/2015, Disp: , Rfl:  .  topiramate (TOPAMAX) 200 MG tablet, Take 200 mg by mouth 2 (two) times daily., Disp: , Rfl:  .  UBIQUINOL PO, Take by mouth., Disp: , Rfl:  .  zolmitriptan (ZOMIG) 5 MG tablet, Take 5 mg by mouth as needed for migraine., Disp: , Rfl:   Vitals:   10/20/15 1353  BP: 130/70  Pulse: 62  Resp: 14  Temp: 97.9 F (36.6 C)  SpO2: 100%  Weight: 163 lb (73.9 kg)  Height: 5\' 6"  (1.676 m)    Body mass index is 26.31 kg/m.         Review of Systems    patient denies chest pain, dyspnea on exertion, PND, orthopnea. Patient does have MS since 2004 doing well. Also history of depression, spinal stenosis, degenerative disc disease, carpal tunnel, GERD. Is able to ambulate long distances she states and this does improve the left leg symptomatology. No history of tobacco abuse. Other systems negative and complete review of systems Objective:   Physical Exam BP 130/70 (BP Location: Left Arm, Patient Position: Sitting, Cuff Size: Normal)   Pulse 62   Temp 97.9 F (36.6 C)   Resp 14   Ht 5\' 6"  (1.676 m)   Wt 163 lb (73.9 kg)   SpO2 100%   BMI 26.31 kg/m     Gen.-alert and oriented x3 in no apparent distress-slight facial tremor noted  HEENT normal for age Lungs no rhonchi or wheezing Cardiovascular regular rhythm no murmurs carotid pulses 3+ palpable no bruits audible Abdomen soft nontender no palpable masses Musculoskeletal free of  major deformities Skin clear -no rashes Neurologic normal Lower extremities 3+ femoral and dorsalis pedis pulses palpable bilaterally with no edema No bulging varicosities noted--a few  spider veins are noted in the distal medial and lateral thighs bilaterally. No hyperpigmentation ulceration or other evidence of venous disease.  Today I ordered a venous duplex exam which I reviewed and interpreted. There is no DVT. There is no significant reflux in the deep or superficial systems. The right small saphenous in the left great saphenous veins are not visualized consistent with previous ablation       Assessment:     #  1 left leg pain-etiology unknown #2 no evidence of significant arterial or venous disease does not need further workup #3 multiple sclerosis since 2004 doing well #4 GERD #5 history of degenerative disc disease #6 history of carpal tunnel #7 history of spinal stenosis Number a history of depression    Plan:     Patient does not need further arterial or venous evaluation as this is not the cause of her left leg discomfort Suggested that she return to her medical doctor for further evaluation and referrals Do not know if this could be due to her MS or other neurologic problem but that is a possibility Return to see me on a when necessary basis

## 2015-10-26 ENCOUNTER — Ambulatory Visit
Admission: RE | Admit: 2015-10-26 | Discharge: 2015-10-26 | Disposition: A | Discharge status: Home / Self Care | Payer: PPO | Source: Ambulatory Visit | Attending: Neurosurgery | Admitting: Neurosurgery

## 2015-10-26 DIAGNOSIS — M4802 Spinal stenosis, cervical region: Secondary | ICD-10-CM | POA: Diagnosis not present

## 2015-10-28 DIAGNOSIS — M4802 Spinal stenosis, cervical region: Secondary | ICD-10-CM | POA: Diagnosis not present

## 2015-10-28 DIAGNOSIS — Z6826 Body mass index (BMI) 26.0-26.9, adult: Secondary | ICD-10-CM | POA: Diagnosis not present

## 2015-11-10 DIAGNOSIS — M4802 Spinal stenosis, cervical region: Secondary | ICD-10-CM | POA: Diagnosis not present

## 2015-11-10 DIAGNOSIS — M4302 Spondylolysis, cervical region: Secondary | ICD-10-CM | POA: Diagnosis not present

## 2015-11-13 ENCOUNTER — Telehealth: Payer: Self-pay | Admitting: Neurology

## 2015-11-13 NOTE — Telephone Encounter (Signed)
Noted/fim 

## 2015-11-13 NOTE — Telephone Encounter (Signed)
Pt's mother called but she is not on DPR. She said the pt was with her but she was asleep and was not going to wake her. The message is mirabegron ER (MYRBETRIQ) 50 MG TB24 tablet needs PA.

## 2015-11-16 ENCOUNTER — Telehealth: Payer: Self-pay | Admitting: *Deleted

## 2015-11-16 NOTE — Telephone Encounter (Signed)
Myrbetriq PA approved.  Case# [id number].  Effective dates 11-16-15-02-28-16.  (Pt. tried and failed both Oxybutynin and Vesicare)/fim

## 2015-11-16 NOTE — Telephone Encounter (Signed)
PA for Myrbetriq completed and submitted via Cover My Meds/fim

## 2015-11-20 DIAGNOSIS — M542 Cervicalgia: Secondary | ICD-10-CM | POA: Diagnosis not present

## 2015-12-01 DIAGNOSIS — H524 Presbyopia: Secondary | ICD-10-CM | POA: Diagnosis not present

## 2015-12-01 DIAGNOSIS — H5203 Hypermetropia, bilateral: Secondary | ICD-10-CM | POA: Diagnosis not present

## 2015-12-08 ENCOUNTER — Ambulatory Visit (HOSPITAL_COMMUNITY): Payer: PPO | Attending: Neurosurgery

## 2015-12-08 DIAGNOSIS — M6281 Muscle weakness (generalized): Secondary | ICD-10-CM | POA: Insufficient documentation

## 2015-12-08 DIAGNOSIS — M542 Cervicalgia: Secondary | ICD-10-CM | POA: Insufficient documentation

## 2015-12-08 DIAGNOSIS — R29898 Other symptoms and signs involving the musculoskeletal system: Secondary | ICD-10-CM | POA: Insufficient documentation

## 2015-12-08 DIAGNOSIS — R293 Abnormal posture: Secondary | ICD-10-CM | POA: Insufficient documentation

## 2015-12-15 ENCOUNTER — Ambulatory Visit (HOSPITAL_COMMUNITY): Payer: PPO | Admitting: Physical Therapy

## 2015-12-15 DIAGNOSIS — M6281 Muscle weakness (generalized): Secondary | ICD-10-CM | POA: Diagnosis not present

## 2015-12-15 DIAGNOSIS — R29898 Other symptoms and signs involving the musculoskeletal system: Secondary | ICD-10-CM

## 2015-12-15 DIAGNOSIS — R293 Abnormal posture: Secondary | ICD-10-CM

## 2015-12-15 DIAGNOSIS — M542 Cervicalgia: Secondary | ICD-10-CM | POA: Diagnosis not present

## 2015-12-15 NOTE — Therapy (Signed)
[facility] [place], [place], [address] Phone: [phone]   Fax:  [phone]  Physical Therapy Evaluation  Patient Details  Name: Nancy Byrd MRN: [id number] Date of Birth: [birth year] Referring Provider: Leeroy Cha   Encounter Date: 12/15/2015      PT End of Session - 12/15/15 1749    Visit Number 1   Number of Visits 12   Date for PT Re-Evaluation 01/05/16   Authorization Type [employer]    Authorization Time Period 12/15/15 to 01/26/16   Authorization - Visit Number 1   Authorization - Number of Visits 10   PT Start Time [id number]  patient arrived few minutes late    PT Stop Time 1427   PT Time Calculation (min) 36 min   Activity Tolerance Patient tolerated treatment well;Patient limited by pain   Behavior During Therapy [facility] for tasks assessed/performed      Past Medical History:  Diagnosis Date  . Carpal tunnel syndrome    right hand  . Collagen vascular disease ([place])   . DDD (degenerative disc disease), cervical   . Depression   . GERD (gastroesophageal reflux disease)   . Heart murmur   . Hypercholesterolemia   . Lumbar disc herniation    L1-L2; L4-L5  . Migraines   . Multiple sclerosis ([place])   . Neuropathy ([place])   . Optic neuritis   . Pernicious anemia   . Rheumatoid arthritis ([place])    right hand  . Spinal stenosis   . Tenosynovitis     Past Surgical History:  Procedure Laterality Date  . APPENDECTOMY  06/07/04  . CARDIAC CATHETERIZATION  06/13/06  . CHOLECYSTECTOMY  1989  . NASAL FRACTURE SURGERY     as a child  . TENOSYNOVECTOMY Bilateral 1985  . TONSILECTOMY, ADENOIDECTOMY, BILATERAL MYRINGOTOMY AND TUBES     as a child  . TOTAL ABDOMINAL HYSTERECTOMY  06/07/04    There were no vitals filed for this visit.       Subjective Assessment - 12/15/15 1358    Subjective Patient arrives wearing soft cervical collar. She states that the stenosis in her neck was found by  accident when she was sent for an MRI for her MS; she is not on any MS medicines, just natural diet and she is very careful about her lifestyle. This MRI was done in January; she reports that her surgery was not done until September, despite advice from MD that waiting could be fatal. She has been feeling "like crap" since surgery; she states that her biggest compliant is the surgery just sapping her energy. She is a very independent person and she is frustrated regarding her preacutions after surgery. When she gets up to do anything, she is immediately covered in sweat, she cannot do what she used to. She frustrated because she has pain now, which she did not before; she only went to surgery because the MD told her she was going to die if she didn't. Her head feels like it is a bobble head.   Pertinent History MS, history of spinal stenosis in cervical region, neuropathy, hx depression, familial tremor   How long can you sit comfortably? immediate discomfort    How long can you stand comfortably? immediate discomfort    How long can you walk comfortably? immediate discomfort    Patient Stated Goals pain reduction, improve function    Currently in Pain? Yes   Pain Score 1    Pain Location  Neck   Pain Orientation Posterior   Pain Descriptors / Indicators Sharp;Stabbing   Pain Type Surgical pain   Pain Radiating Towards none    Pain Onset 1 to 4 weeks ago   Pain Frequency Intermittent   Aggravating Factors  looking up, looking to the right    Pain Relieving Factors looking straight ahead, support from cervical collar    Effect of Pain on Daily Activities severe reduction in independence             [facility] PT Assessment - 12/15/15 0001      Assessment   Medical Diagnosis cervical spinal stenosis    Referring Provider Leeroy Cha    Onset Date/Surgical Date 12/10/15   Next MD Visit Dr. Joya Salm 13th of November    Prior Therapy none      Precautions   Precaution Comments needs to wear  cervical collar when outside (per MD); cannot lift anything more than 10#      Balance Screen   Has the patient fallen in the past 6 months Yes   How many times? multiple, patient states she cannot remember how many    Has the patient had a decrease in activity level because of a fear of falling?  No   Is the patient reluctant to leave their home because of a fear of falling?  No     Prior Function   Level of Independence Independent;Independent with basic ADLs;Independent with gait;Independent with transfers   Vocation On disability     Posture/Postural Control   Posture/Postural Control Postural limitations   Postural Limitations Rounded Shoulders;Forward head;Flexed trunk     AROM   Cervical Flexion 52   Cervical Extension 23  with UE support    Cervical - Right Side Bend moderate limitation, upper trap compensation    Cervical - Left Side Bend moderate limitation, upper trap compensation    Cervical - Right Rotation moderate limitation    Cervical - Left Rotation moderate limitation      Strength   Right Shoulder Flexion 5/5   Right Shoulder Extension 5/5   Right Shoulder Internal Rotation 5/5   Right Shoulder External Rotation 5/5   Left Shoulder Flexion 5/5   Left Shoulder Extension 5/5   Left Shoulder Internal Rotation 5/5   Left Shoulder External Rotation 5/5                           PT Education - 12/15/15 1749    Education provided Yes   Education Details prognosis, POC, HEP to be given next session; soft cervical collars are not beneficial for the neck in general/do not provide protection    Person(s) Educated Patient   Methods Explanation   Comprehension Verbalized understanding          PT Short Term Goals - 12/15/15 1800      PT SHORT TERM GOAL #1   Title Patient to improve cervical flexion/extension by at least 15 degrees and will demonstrate only minimal limitation in bilateral cervical lateral flexion and rotation in order to  reduce pain and improve QOL    Time 3   Period Weeks   Status New     PT SHORT TERM GOAL #2   Title Patient to demonstrate thoracic ROM as being full and pain free on all planes in order to assist in improving posture and reducing pain    Time 3   Period Weeks   Status New  PT SHORT TERM GOAL #3   Title Patient to demonstrate correct functional posture at least 70% of the time during all functional tasks in order to improve mechanics and reduce pain    Time 3   Period Weeks   Status New     PT SHORT TERM GOAL #4   Title Patient to be able to verbally state the importance of being out of her cervical brace, after MD clearance, in order to build cervical strength/improve ROM and reduce pain    Time 3   Period Weeks   Status New     PT SHORT TERM GOAL #5   Title Patient to be independent in correctly and consistently performing appropriate HEP, to be updated PRN   Time 1   Period Weeks   Status New           PT Long Term Goals - 12/15/15 1804      PT LONG TERM GOAL #1   Title Patient to demonstrate cervical strength at least 4+/5 on all tested planes in order to reduce pain and improve function    Time 6   Period Weeks   Status New     PT LONG TERM GOAL #2   Title Patient to report pain no more than 3/10 in order to improve QOL and facilitate return to PLOF    Time 6   Period Weeks   Status New     PT LONG TERM GOAL #3   Title Patient to report she has been able to perform all PLOF based activities with no exacerbation in cervical pain in order to improve QOL    Time 6   Period Weeks   Status New               Plan - 12/15/15 1758    Clinical Impression Statement Patient states she was diagnosed with cervical spinal stenosis in January 2017 so severe that her MD told her she was literally risking her life by delaying surgery due to possible impingement on spinal cord; patient willingly delayed surgery until September 2017 and reports that she has been  "absolutely miserable" since that point. She arrives in soft cervical collar and states that MD told her she absolutely has to wear this when outdoors, although she can take it off when inside- however per the patient, she is ALWAYS outside and is thus always in cervical collar. Skilled examination was limited today secondary to patient being very talkative today and requiring frequent redirection towards evaluation, however patient does demonstrate impairments in posture, cervical and likely thoracic mobility/ROM, severe deficits in cervical strength likely due to extended cervical collar use, and ongoing pain related to surgery. She will benefit from skilled PT services to address functional impairments, reduce pain, and reach optimal level of function.     Rehab Potential Good   Clinical Impairments Affecting Rehab Potential MS, extended time in cervical collar not beneficial to eventual functional outcomes, extensive surgery to cervical spine, however patient lives alone and is very independent    PT Frequency 2x / week   PT Duration 6 weeks   PT Treatment/Interventions ADLs/Self Care Home Management;Biofeedback;Cryotherapy;Moist Heat;Functional mobility training;Therapeutic activities;Therapeutic exercise;Balance training;Neuromuscular re-education;Patient/family education;Manual techniques;Passive range of motion;Energy conservation;Taping   PT Next Visit Plan review goals and assign HEP; focus on posture, cervical mobility, STM and cervical/postural strength as able to tolerate    PT Home Exercise Plan ASSIGN HEP: cervical ROM, postural exercises    Consulted and Agree with Plan  of Care Patient      Patient will benefit from skilled therapeutic intervention in order to improve the following deficits and impairments:  Increased fascial restricitons, Improper body mechanics, Pain, Decreased mobility, Increased muscle spasms, Postural dysfunction, Decreased range of motion, Hypomobility, Impaired  flexibility  Visit Diagnosis: Cervicalgia - Plan: PT plan of care cert/re-cert  Abnormal posture - Plan: PT plan of care cert/re-cert  Other symptoms and signs involving the musculoskeletal system - Plan: PT plan of care cert/re-cert  Muscle weakness (generalized) - Plan: PT plan of care cert/re-cert      G-Codes - [id number]    Functional Assessment Tool Used Based on skilled clinical assessment of ROM, pain patterns, general functional presentation, posture    Functional Limitation Mobility: Walking and moving around   Mobility: Walking and Moving Around Current Status [id number]) At least 60 percent but less than 80 percent impaired, limited or restricted   Mobility: Walking and Moving Around Goal Status [phone]) At least 40 percent but less than 60 percent impaired, limited or restricted       Problem List Patient Active Problem List   Diagnosis Date Noted  . Leg pain, left 10/20/2015  . Spinal stenosis in cervical region 10/15/2015  . Gait disturbance 02/18/2015  . High risk medication use 02/18/2015  . Urinary incontinence 02/18/2015  . Depression with anxiety 02/18/2015  . Disturbed cognition 02/18/2015  . Other fatigue 02/18/2015  . Hallux rigidus of left foot 05/08/2014  . Multiple sclerosis ([place]) 05/08/2014  . Rheumatoid arthritis of foot ([place]) 05/08/2014  . Rectal bleeding 04/09/2014  . Constipation 04/09/2014    Deniece Ree PT, DPT [facility] [address] [place], [place], [address] Phone: [phone]   Fax:  [phone]  Name: Nancy Byrd MRN: [id number] Date of Birth: 27-Mar-1965

## 2015-12-22 ENCOUNTER — Ambulatory Visit (HOSPITAL_COMMUNITY): Payer: PPO | Admitting: Physical Therapy

## 2015-12-22 DIAGNOSIS — R29898 Other symptoms and signs involving the musculoskeletal system: Secondary | ICD-10-CM

## 2015-12-22 DIAGNOSIS — M6281 Muscle weakness (generalized): Secondary | ICD-10-CM

## 2015-12-22 DIAGNOSIS — R293 Abnormal posture: Secondary | ICD-10-CM

## 2015-12-22 DIAGNOSIS — M542 Cervicalgia: Secondary | ICD-10-CM | POA: Diagnosis not present

## 2015-12-22 NOTE — Therapy (Signed)
[place], [place], [address] Phone: [phone]   Fax:  [phone]  Physical Therapy Treatment  Patient Details  Name: Nancy Byrd MRN: [id number] Date of Birth: [birth year] Referring Provider: Leeroy Cha   Encounter Date: 12/22/2015      PT End of Session - 12/22/15 1740    Visit Number 2   Number of Visits 12   Date for PT Re-Evaluation 01/05/16   Authorization Type [employer]    Authorization Time Period 12/15/15 to 01/26/16   Authorization - Visit Number 2   Authorization - Number of Visits 10   PT Start Time [id number]   PT Stop Time 1520   PT Time Calculation (min) 38 min   Activity Tolerance Patient tolerated treatment well   Behavior During Therapy [facility] for tasks assessed/performed      Past Medical History:  Diagnosis Date  . Carpal tunnel syndrome    right hand  . Collagen vascular disease ([place])   . DDD (degenerative disc disease), cervical   . Depression   . GERD (gastroesophageal reflux disease)   . Heart murmur   . Hypercholesterolemia   . Lumbar disc herniation    L1-L2; L4-L5  . Migraines   . Multiple sclerosis ([place])   . Neuropathy ([place])   . Optic neuritis   . Pernicious anemia   . Rheumatoid arthritis ([place])    right hand  . Spinal stenosis   . Tenosynovitis     Past Surgical History:  Procedure Laterality Date  . APPENDECTOMY  06/07/04  . CARDIAC CATHETERIZATION  06/13/06  . CHOLECYSTECTOMY  1989  . NASAL FRACTURE SURGERY     as a child  . TENOSYNOVECTOMY Bilateral 1985  . TONSILECTOMY, ADENOIDECTOMY, BILATERAL MYRINGOTOMY AND TUBES     as a child  . TOTAL ABDOMINAL HYSTERECTOMY  06/07/04    There were no vitals filed for this visit.      Subjective Assessment - 12/22/15 1459    Subjective Pt was 10 minutes late for appt.  States her pain today is 5/10 after taking motrin and a flexeril.  Comes today without her soft collar due to her chicken "pooping" on  it.  Currently wears her soft collar outdoors only.   Currently in Pain? Yes   Pain Score 5    Pain Location Neck   Pain Orientation Right                         [facility] Adult PT Treatment/Exercise - 12/22/15 0001      Neck Exercises: Seated   Other Seated Exercise cervical excursions 5 reps each     Manual Therapy   Manual Therapy Soft tissue mobilization   Manual therapy comments seated, completed seperately from all other skilled interventions   Soft tissue mobilization Rt cervical and upper trap                   PT Short Term Goals - 12/15/15 1800      PT SHORT TERM GOAL #1   Title Patient to improve cervical flexion/extension by at least 15 degrees and will demonstrate only minimal limitation in bilateral cervical lateral flexion and rotation in order to reduce pain and improve QOL    Time 3   Period Weeks   Status New     PT SHORT TERM GOAL #2   Title Patient to demonstrate thoracic ROM as being full and pain  free on all planes in order to assist in improving posture and reducing pain    Time 3   Period Weeks   Status New     PT SHORT TERM GOAL #3   Title Patient to demonstrate correct functional posture at least 70% of the time during all functional tasks in order to improve mechanics and reduce pain    Time 3   Period Weeks   Status New     PT SHORT TERM GOAL #4   Title Patient to be able to verbally state the importance of being out of her cervical brace, after MD clearance, in order to build cervical strength/improve ROM and reduce pain    Time 3   Period Weeks   Status New     PT SHORT TERM GOAL #5   Title Patient to be independent in correctly and consistently performing appropriate HEP, to be updated PRN   Time 1   Period Weeks   Status New           PT Long Term Goals - 12/15/15 1804      PT LONG TERM GOAL #1   Title Patient to demonstrate cervical strength at least 4+/5 on all tested planes in order to reduce pain  and improve function    Time 6   Period Weeks   Status New     PT LONG TERM GOAL #2   Title Patient to report pain no more than 3/10 in order to improve QOL and facilitate return to PLOF    Time 6   Period Weeks   Status New     PT LONG TERM GOAL #3   Title Patient to report she has been able to perform all PLOF based activities with no exacerbation in cervical pain in order to improve QOL    Time 6   Period Weeks   Status New               Plan - 12/22/15 1741    Clinical Impression Statement Reviewed HEP including goals with patient and initiated HEP that included cervical excursions.  PT wtihout collar on and with noted decent cervical motion.  Extension and Lt side bending appear to be most difficult.  No difficulty with bilateral rotations.  Pt able to complete all these with minimal discomfort.  Given written HEP to include these exercises.  Manual completed to cervical area with 2 large spasms in Rt upper trap.  Able to resolve 80% with immediate relief verbalized from patient.     Rehab Potential Good   Clinical Impairments Affecting Rehab Potential MS, extended time in cervical collar not beneficial to eventual functional outcomes, extensive surgery to cervical spine, however patient lives alone and is very independent    PT Frequency 2x / week   PT Duration 6 weeks   PT Treatment/Interventions ADLs/Self Care Home Management;Biofeedback;Cryotherapy;Moist Heat;Functional mobility training;Therapeutic activities;Therapeutic exercise;Balance training;Neuromuscular re-education;Patient/family education;Manual techniques;Passive range of motion;Energy conservation;Taping   PT Next Visit Plan Progress exercises with focus on posture, cervical mobility, STM and cervical/postural strength as able to tolerate.  Next session add cervical isometrics and postural exercises.    PT Home Exercise Plan 12/22/15:  Cervical excursions all motions 5 reps each.   Consulted and Agree with Plan  of Care Patient      Patient will benefit from skilled therapeutic intervention in order to improve the following deficits and impairments:  Increased fascial restricitons, Improper body mechanics, Pain, Decreased mobility, Increased muscle spasms, Postural  dysfunction, Decreased range of motion, Hypomobility, Impaired flexibility  Visit Diagnosis: Cervicalgia  Abnormal posture  Other symptoms and signs involving the musculoskeletal system  Muscle weakness (generalized)     Problem List Patient Active Problem List   Diagnosis Date Noted  . Leg pain, left 10/20/2015  . Spinal stenosis in cervical region 10/15/2015  . Gait disturbance 02/18/2015  . High risk medication use 02/18/2015  . Urinary incontinence 02/18/2015  . Depression with anxiety 02/18/2015  . Disturbed cognition 02/18/2015  . Other fatigue 02/18/2015  . Hallux rigidus of left foot 05/08/2014  . Multiple sclerosis ([place]) 05/08/2014  . Rheumatoid arthritis of foot ([place]) 05/08/2014  . Rectal bleeding 04/09/2014  . Constipation 04/09/2014    Teena Irani, PTA/CLT [phone]  12/22/2015, 5:50 PM  [place], [place], [address] Phone: [phone]   Fax:  [phone]  Name: Nancy Byrd MRN: [id number] Date of Birth: [birth year]

## 2015-12-24 ENCOUNTER — Ambulatory Visit (HOSPITAL_COMMUNITY): Payer: PPO

## 2015-12-24 DIAGNOSIS — R29898 Other symptoms and signs involving the musculoskeletal system: Secondary | ICD-10-CM

## 2015-12-24 DIAGNOSIS — M542 Cervicalgia: Secondary | ICD-10-CM | POA: Diagnosis not present

## 2015-12-24 DIAGNOSIS — M6281 Muscle weakness (generalized): Secondary | ICD-10-CM

## 2015-12-24 DIAGNOSIS — R293 Abnormal posture: Secondary | ICD-10-CM

## 2015-12-24 NOTE — Therapy (Signed)
[place], [place], [address] Phone: [phone]   Fax:  [phone]  Physical Therapy Treatment  Patient Details  Name: Nancy Byrd MRN: [id number] Date of Birth: 1965/03/31 Referring Provider: Leeroy Cha   Encounter Date: 12/24/2015      PT End of Session - 12/24/15 1704    Visit Number 3   Number of Visits 12   Date for PT Re-Evaluation 01/05/16   Authorization Type [employer]    Authorization Time Period 12/15/15 to 01/26/16   Authorization - Visit Number 3   Authorization - Number of Visits 10   PT Start Time 1623   PT Stop Time 1648   PT Time Calculation (min) 25 min   Activity Tolerance Patient tolerated treatment well   Behavior During Therapy [facility] for tasks assessed/performed      Past Medical History:  Diagnosis Date  . Carpal tunnel syndrome    right hand  . Collagen vascular disease ([place])   . DDD (degenerative disc disease), cervical   . Depression   . GERD (gastroesophageal reflux disease)   . Heart murmur   . Hypercholesterolemia   . Lumbar disc herniation    L1-L2; L4-L5  . Migraines   . Multiple sclerosis ([place])   . Neuropathy ([place])   . Optic neuritis   . Pernicious anemia   . Rheumatoid arthritis ([place])    right hand  . Spinal stenosis   . Tenosynovitis     Past Surgical History:  Procedure Laterality Date  . APPENDECTOMY  06/07/04  . CARDIAC CATHETERIZATION  06/13/06  . CHOLECYSTECTOMY  1989  . NASAL FRACTURE SURGERY     as a child  . TENOSYNOVECTOMY Bilateral 1985  . TONSILECTOMY, ADENOIDECTOMY, BILATERAL MYRINGOTOMY AND TUBES     as a child  . TOTAL ABDOMINAL HYSTERECTOMY  06/07/04    There were no vitals filed for this visit.      Subjective Assessment - 12/24/15 1625    Subjective (P)  Pt was 20 minutes late for apt today.  Reports pain is minimal today 2/10 after taking motrin and a flexeril.  Reports she took hot shower earlier today to help  soften up.     Pertinent History (P)  MS, history of spinal stenosis in cervical region, neuropathy, hx depression, familial tremor   Patient Stated Goals (P)  pain reduction, improve function    Currently in Pain? (P)  Yes   Pain Score (P)  2               [facility] Adult PT Treatment/Exercise - 12/24/15 0001      Neck Exercises: Seated   Other Seated Exercise 3D cervical excursions 5 reps each     Neck Exercises: Supine   Neck Retraction 5 reps;5 secs Verbal, tactile and demonstration cueing required     Manual Therapy   Manual Therapy Soft tissue mobilization   Manual therapy comments supine position; completed seperately from all other skilled interventions   Soft tissue mobilization Upper traps in supine position with LE elevated for LBP                   PT Short Term Goals - 12/15/15 1800      PT SHORT TERM GOAL #1   Title Patient to improve cervical flexion/extension by at least 15 degrees and will demonstrate only minimal limitation in bilateral cervical lateral flexion and rotation in order to reduce pain and improve  QOL    Time 3   Period Weeks   Status New     PT SHORT TERM GOAL #2   Title Patient to demonstrate thoracic ROM as being full and pain free on all planes in order to assist in improving posture and reducing pain    Time 3   Period Weeks   Status New     PT SHORT TERM GOAL #3   Title Patient to demonstrate correct functional posture at least 70% of the time during all functional tasks in order to improve mechanics and reduce pain    Time 3   Period Weeks   Status New     PT SHORT TERM GOAL #4   Title Patient to be able to verbally state the importance of being out of her cervical brace, after MD clearance, in order to build cervical strength/improve ROM and reduce pain    Time 3   Period Weeks   Status New     PT SHORT TERM GOAL #5   Title Patient to be independent in correctly and consistently performing appropriate HEP, to be  updated PRN   Time 1   Period Weeks   Status New           PT Long Term Goals - 12/15/15 1804      PT LONG TERM GOAL #1   Title Patient to demonstrate cervical strength at least 4+/5 on all tested planes in order to reduce pain and improve function    Time 6   Period Weeks   Status New     PT LONG TERM GOAL #2   Title Patient to report pain no more than 3/10 in order to improve QOL and facilitate return to PLOF    Time 6   Period Weeks   Status New     PT LONG TERM GOAL #3   Title Patient to report she has been able to perform all PLOF based activities with no exacerbation in cervical pain in order to improve QOL    Time 6   Period Weeks   Status New               Plan - 12/24/15 1709    Clinical Impression Statement Pt late for apt, unable to complete full POC.  Session focus on improving awareness of posture, began postural strengthening and ended session wtih manual techniques to address tightness on upper trap musculature.  Pt required multimodal cueing with cervical retraction, able to complete in supine position with moderate cueing.     Rehab Potential Good   Clinical Impairments Affecting Rehab Potential MS, extended time in cervical collar not beneficial to eventual functional outcomes, extensive surgery to cervical spine, however patient lives alone and is very independent    PT Frequency 2x / week   PT Duration 6 weeks   PT Treatment/Interventions ADLs/Self Care Home Management;Biofeedback;Cryotherapy;Moist Heat;Functional mobility training;Therapeutic activities;Therapeutic exercise;Balance training;Neuromuscular re-education;Patient/family education;Manual techniques;Passive range of motion;Energy conservation;Taping   PT Next Visit Plan Progress exercises with focus on posture, cervical mobility, STM and cervical/postural strength as able to tolerate.  Next session add cervical isometrics and postural exercises.       Patient will benefit from skilled  therapeutic intervention in order to improve the following deficits and impairments:  Increased fascial restricitons, Improper body mechanics, Pain, Decreased mobility, Increased muscle spasms, Postural dysfunction, Decreased range of motion, Hypomobility, Impaired flexibility  Visit Diagnosis: Cervicalgia  Abnormal posture  Other symptoms and signs involving the musculoskeletal  system  Muscle weakness (generalized)     Problem List Patient Active Problem List   Diagnosis Date Noted  . Leg pain, left 10/20/2015  . Spinal stenosis in cervical region 10/15/2015  . Gait disturbance 02/18/2015  . High risk medication use 02/18/2015  . Urinary incontinence 02/18/2015  . Depression with anxiety 02/18/2015  . Disturbed cognition 02/18/2015  . Other fatigue 02/18/2015  . Hallux rigidus of left foot 05/08/2014  . Multiple sclerosis ([place]) 05/08/2014  . Rheumatoid arthritis of foot ([place]) 05/08/2014  . Rectal bleeding 04/09/2014  . Constipation 04/09/2014   Ihor Austin, [place]; [place]  Aldona Lento 12/24/2015, 6:20 PM  [place], [place], [address] Phone: [phone]   Fax:  [phone]  Name: Nancy Byrd MRN: [id number] Date of Birth: November 02, 1965

## 2015-12-29 ENCOUNTER — Ambulatory Visit (HOSPITAL_COMMUNITY): Payer: PPO | Admitting: Physical Therapy

## 2015-12-29 ENCOUNTER — Telehealth (HOSPITAL_COMMUNITY): Payer: Self-pay | Admitting: Family Medicine

## 2015-12-29 NOTE — Telephone Encounter (Signed)
12/29/15 left a messge to cx she said that she had a bad headache and didn't want to drive in the sun, it makes it worse

## 2015-12-31 ENCOUNTER — Ambulatory Visit (HOSPITAL_COMMUNITY): Payer: PPO | Attending: Neurosurgery | Admitting: Physical Therapy

## 2015-12-31 DIAGNOSIS — R29898 Other symptoms and signs involving the musculoskeletal system: Secondary | ICD-10-CM | POA: Insufficient documentation

## 2015-12-31 DIAGNOSIS — M6281 Muscle weakness (generalized): Secondary | ICD-10-CM | POA: Insufficient documentation

## 2015-12-31 DIAGNOSIS — M542 Cervicalgia: Secondary | ICD-10-CM | POA: Insufficient documentation

## 2015-12-31 DIAGNOSIS — R293 Abnormal posture: Secondary | ICD-10-CM | POA: Insufficient documentation

## 2016-01-04 ENCOUNTER — Ambulatory Visit (HOSPITAL_COMMUNITY): Payer: PPO | Admitting: Physical Therapy

## 2016-01-04 DIAGNOSIS — R29898 Other symptoms and signs involving the musculoskeletal system: Secondary | ICD-10-CM

## 2016-01-04 DIAGNOSIS — M542 Cervicalgia: Secondary | ICD-10-CM | POA: Diagnosis not present

## 2016-01-04 DIAGNOSIS — R293 Abnormal posture: Secondary | ICD-10-CM | POA: Diagnosis not present

## 2016-01-04 DIAGNOSIS — M6281 Muscle weakness (generalized): Secondary | ICD-10-CM | POA: Diagnosis not present

## 2016-01-04 NOTE — Therapy (Signed)
[place], [place], [address] Phone: [phone]   Fax:  [phone]  Physical Therapy Treatment  Patient Details  Name: Nancy Byrd MRN: [id number] Date of Birth: January 17, 1966 Referring Provider: Leeroy Cha   Encounter Date: 01/04/2016      PT End of Session - 01/04/16 1324    Visit Number 4   Number of Visits 12   Date for PT Re-Evaluation 01/05/16   Authorization Type [employer]    Authorization Time Period 12/15/15 to 01/26/16   Authorization - Visit Number 4   Authorization - Number of Visits 10   PT Start Time [id number]   PT Stop Time 1345   PT Time Calculation (min) 33 min   Activity Tolerance Patient tolerated treatment well   Behavior During Therapy [facility] for tasks assessed/performed      Past Medical History:  Diagnosis Date  . Carpal tunnel syndrome    right hand  . Collagen vascular disease ([place])   . DDD (degenerative disc disease), cervical   . Depression   . GERD (gastroesophageal reflux disease)   . Heart murmur   . Hypercholesterolemia   . Lumbar disc herniation    L1-L2; L4-L5  . Migraines   . Multiple sclerosis ([place])   . Neuropathy ([place])   . Optic neuritis   . Pernicious anemia   . Rheumatoid arthritis ([place])    right hand  . Spinal stenosis   . Tenosynovitis     Past Surgical History:  Procedure Laterality Date  . APPENDECTOMY  06/07/04  . CARDIAC CATHETERIZATION  06/13/06  . CHOLECYSTECTOMY  1989  . NASAL FRACTURE SURGERY     as a child  . TENOSYNOVECTOMY Bilateral 1985  . TONSILECTOMY, ADENOIDECTOMY, BILATERAL MYRINGOTOMY AND TUBES     as a child  . TOTAL ABDOMINAL HYSTERECTOMY  06/07/04    There were no vitals filed for this visit.      Subjective Assessment - 01/04/16 1311    Subjective PT was 7 minutes late for appointment then sat out front talking to secretary, not beginning therapy until 12 minutes after appoitnment started.  Pt reports she took a  hot shower before coming and she is not having any pain currently.   Currently in Pain? No/denies                         [facility] Adult PT Treatment/Exercise - 01/04/16 0001      Neck Exercises: Theraband   Scapula Retraction 10 reps;Red   Shoulder Extension 10 reps;Red   Rows 10 reps;Red     Neck Exercises: Seated   Cervical Isometrics Flexion;Extension;Right lateral flexion;Left lateral flexion;5 reps;3 secs   Other Seated Exercise 3D cervical excursions 5 reps each     Manual Therapy   Manual Therapy Soft tissue mobilization   Manual therapy comments seated,  completed seperately from all other skilled interventions   Soft tissue mobilization Upper traps in seated position with LE elevated for LBP                   PT Short Term Goals - 12/15/15 1800      PT SHORT TERM GOAL #1   Title Patient to improve cervical flexion/extension by at least 15 degrees and will demonstrate only minimal limitation in bilateral cervical lateral flexion and rotation in order to reduce pain and improve QOL    Time 3   Period Weeks  Status New     PT SHORT TERM GOAL #2   Title Patient to demonstrate thoracic ROM as being full and pain free on all planes in order to assist in improving posture and reducing pain    Time 3   Period Weeks   Status New     PT SHORT TERM GOAL #3   Title Patient to demonstrate correct functional posture at least 70% of the time during all functional tasks in order to improve mechanics and reduce pain    Time 3   Period Weeks   Status New     PT SHORT TERM GOAL #4   Title Patient to be able to verbally state the importance of being out of her cervical brace, after MD clearance, in order to build cervical strength/improve ROM and reduce pain    Time 3   Period Weeks   Status New     PT SHORT TERM GOAL #5   Title Patient to be independent in correctly and consistently performing appropriate HEP, to be updated PRN   Time 1   Period  Weeks   Status New           PT Long Term Goals - 12/15/15 1804      PT LONG TERM GOAL #1   Title Patient to demonstrate cervical strength at least 4+/5 on all tested planes in order to reduce pain and improve function    Time 6   Period Weeks   Status New     PT LONG TERM GOAL #2   Title Patient to report pain no more than 3/10 in order to improve QOL and facilitate return to PLOF    Time 6   Period Weeks   Status New     PT LONG TERM GOAL #3   Title Patient to report she has been able to perform all PLOF based activities with no exacerbation in cervical pain in order to improve QOL    Time 6   Period Weeks   Status New               Plan - 01/04/16 1324    Clinical Impression Statement Pt late for appt.  Much improved mobility since beginning therapy and is pretty much with full motion except for Rt rotation.  Began postural strengthening with theraband with explanation of exercise as well as adding cervical isometrics.  continued with with manual to bilateral cervical with only one small spasm in Rt upper trap region, totally resolved with manual. Pt without c/o pain at end of session.   Rehab Potential Good   Clinical Impairments Affecting Rehab Potential MS, extended time in cervical collar not beneficial to eventual functional outcomes, extensive surgery to cervical spine, however patient lives alone and is very independent    PT Frequency 2x / week   PT Duration 6 weeks   PT Treatment/Interventions ADLs/Self Care Home Management;Biofeedback;Cryotherapy;Moist Heat;Functional mobility training;Therapeutic activities;Therapeutic exercise;Balance training;Neuromuscular re-education;Patient/family education;Manual techniques;Passive range of motion;Energy conservation;Taping   PT Next Visit Plan Progress exercises with focus on posture, cervical mobility, STM and cervical/postural strength as able to tolerate.      Patient will benefit from skilled therapeutic  intervention in order to improve the following deficits and impairments:  Increased fascial restricitons, Improper body mechanics, Pain, Decreased mobility, Increased muscle spasms, Postural dysfunction, Decreased range of motion, Hypomobility, Impaired flexibility  Visit Diagnosis: Cervicalgia  Abnormal posture  Other symptoms and signs involving the musculoskeletal system  Muscle weakness (generalized)  Problem List Patient Active Problem List   Diagnosis Date Noted  . Leg pain, left 10/20/2015  . Spinal stenosis in cervical region 10/15/2015  . Gait disturbance 02/18/2015  . High risk medication use 02/18/2015  . Urinary incontinence 02/18/2015  . Depression with anxiety 02/18/2015  . Disturbed cognition 02/18/2015  . Other fatigue 02/18/2015  . Hallux rigidus of left foot 05/08/2014  . Multiple sclerosis ([place]) 05/08/2014  . Rheumatoid arthritis of foot ([place]) 05/08/2014  . Rectal bleeding 04/09/2014  . Constipation 04/09/2014    Teena Irani, PTA/CLT [phone]  01/04/2016, 1:42 PM  [place] [address] [place], [place], [address] Phone: [phone]   Fax:  [phone]  Name: CLERA NEESON MRN: [id number] Date of Birth: 1965/10/02

## 2016-01-07 ENCOUNTER — Telehealth (HOSPITAL_COMMUNITY): Payer: Self-pay | Admitting: Physical Therapy

## 2016-01-07 ENCOUNTER — Ambulatory Visit (HOSPITAL_COMMUNITY): Payer: PPO | Admitting: Physical Therapy

## 2016-01-07 NOTE — Telephone Encounter (Signed)
She was busy outside and the time got away, no way she can be here on time today.

## 2016-01-12 ENCOUNTER — Ambulatory Visit (HOSPITAL_COMMUNITY): Payer: PPO | Admitting: Physical Therapy

## 2016-01-13 ENCOUNTER — Telehealth (HOSPITAL_COMMUNITY): Payer: Self-pay | Admitting: Physical Therapy

## 2016-01-13 NOTE — Telephone Encounter (Signed)
Called pt re missed appointment yesterday.  Pt states that she was ill and tomorrow, her next appointment, she has court.  Let her know that her next appointment will be Monday at 1:45 pm   Rayetta Humphrey, [place] CLT [phone]

## 2016-01-14 ENCOUNTER — Ambulatory Visit (HOSPITAL_COMMUNITY): Payer: PPO | Admitting: Physical Therapy

## 2016-01-18 ENCOUNTER — Telehealth (HOSPITAL_COMMUNITY): Payer: Self-pay | Admitting: Physical Therapy

## 2016-01-18 ENCOUNTER — Telehealth (HOSPITAL_COMMUNITY): Payer: Self-pay | Admitting: Family Medicine

## 2016-01-18 ENCOUNTER — Ambulatory Visit (HOSPITAL_COMMUNITY): Payer: PPO | Admitting: Physical Therapy

## 2016-01-18 DIAGNOSIS — M4802 Spinal stenosis, cervical region: Secondary | ICD-10-CM | POA: Diagnosis not present

## 2016-01-18 NOTE — Telephone Encounter (Signed)
Attempted to call patient to see if she will be coming to today's appointment however went to voicemail; left message asking that  She call us back if she cannot make this appointment, otherwise we will see her at 1:45 today.   Deniece Ree PT, DPT [phone]

## 2016-01-18 NOTE — Telephone Encounter (Signed)
01/18/16 pt left a message that she needed to cx today's appt because she was in court

## 2016-01-19 ENCOUNTER — Encounter (HOSPITAL_COMMUNITY): Payer: Self-pay | Admitting: Physical Therapy

## 2016-01-19 ENCOUNTER — Ambulatory Visit (HOSPITAL_COMMUNITY): Payer: PPO | Admitting: Physical Therapy

## 2016-01-19 ENCOUNTER — Telehealth (HOSPITAL_COMMUNITY): Payer: Self-pay | Admitting: Physical Therapy

## 2016-01-19 NOTE — Therapy (Signed)
[place], [place], [address] Phone: [phone]   Fax:  [phone]  Patient Details  Name: Nancy Byrd MRN: [id number] Date of Birth: 11-18-1965 Referring Provider:  No ref. provider found  Encounter Date: 01/19/2016   PHYSICAL THERAPY DISCHARGE SUMMARY  Visits from Start of Care: 4  Current functional level related to goals / functional outcomes: Patient is being discharged due to poor attendance and 3+ no-shows, per clinic policy. She has shown very poor attendance and clinical staff suspects little to no compliance with DPT HEP or advice. Patient is being discharged today, she will need new MD order to return to PT.    Remaining deficits: Unable to assess    Education / Equipment: DC today per clinic policy  Plan: Patient agrees to discharge.  Patient goals were not met. Patient is being discharged due to not returning since the last visit.  ?????      Deniece Ree PT, DPT [place] [address] [place], [place], [address] Phone: [phone]   Fax:  [phone]

## 2016-01-19 NOTE — Telephone Encounter (Signed)
Called and spoke to patient's mother; educated that patient is being DC-ed due to poor compliance and not coming to PT (3 no-shows per clinic policy). Mother states that patient went to referring MD who told her PT is not helping her at all, there's no point to it.   Deniece Ree PT, DPT [phone]

## 2016-01-26 ENCOUNTER — Ambulatory Visit (HOSPITAL_COMMUNITY): Payer: PPO | Admitting: Physical Therapy

## 2016-01-26 DIAGNOSIS — S61551A Open bite of right wrist, initial encounter: Secondary | ICD-10-CM | POA: Diagnosis not present

## 2016-01-26 DIAGNOSIS — W540XXA Bitten by dog, initial encounter: Secondary | ICD-10-CM | POA: Diagnosis not present

## 2016-01-27 DIAGNOSIS — S61551A Open bite of right wrist, initial encounter: Secondary | ICD-10-CM | POA: Diagnosis not present

## 2016-01-27 DIAGNOSIS — W540XXA Bitten by dog, initial encounter: Secondary | ICD-10-CM | POA: Diagnosis not present

## 2016-01-28 ENCOUNTER — Encounter (HOSPITAL_COMMUNITY): Payer: PPO | Admitting: Physical Therapy

## 2016-01-28 DIAGNOSIS — S61551A Open bite of right wrist, initial encounter: Secondary | ICD-10-CM | POA: Diagnosis not present

## 2016-01-28 DIAGNOSIS — W540XXA Bitten by dog, initial encounter: Secondary | ICD-10-CM | POA: Diagnosis not present

## 2016-02-01 DIAGNOSIS — W540XXA Bitten by dog, initial encounter: Secondary | ICD-10-CM | POA: Diagnosis not present

## 2016-02-01 DIAGNOSIS — S61551A Open bite of right wrist, initial encounter: Secondary | ICD-10-CM | POA: Diagnosis not present

## 2016-02-15 DIAGNOSIS — K623 Rectal prolapse: Secondary | ICD-10-CM | POA: Diagnosis not present

## 2016-02-15 DIAGNOSIS — F900 Attention-deficit hyperactivity disorder, predominantly inattentive type: Secondary | ICD-10-CM | POA: Diagnosis not present

## 2016-02-15 DIAGNOSIS — G35 Multiple sclerosis: Secondary | ICD-10-CM | POA: Diagnosis not present

## 2016-02-15 DIAGNOSIS — I7300 Raynaud's syndrome without gangrene: Secondary | ICD-10-CM | POA: Diagnosis not present

## 2016-02-15 DIAGNOSIS — Z6828 Body mass index (BMI) 28.0-28.9, adult: Secondary | ICD-10-CM | POA: Diagnosis not present

## 2016-02-15 DIAGNOSIS — M797 Fibromyalgia: Secondary | ICD-10-CM | POA: Diagnosis not present

## 2016-02-15 DIAGNOSIS — Z9189 Other specified personal risk factors, not elsewhere classified: Secondary | ICD-10-CM | POA: Diagnosis not present

## 2016-02-15 DIAGNOSIS — F331 Major depressive disorder, recurrent, moderate: Secondary | ICD-10-CM | POA: Diagnosis not present

## 2016-02-15 DIAGNOSIS — M4712 Other spondylosis with myelopathy, cervical region: Secondary | ICD-10-CM | POA: Diagnosis not present

## 2016-02-15 DIAGNOSIS — K219 Gastro-esophageal reflux disease without esophagitis: Secondary | ICD-10-CM | POA: Diagnosis not present

## 2016-02-26 DIAGNOSIS — M654 Radial styloid tenosynovitis [de Quervain]: Secondary | ICD-10-CM | POA: Diagnosis not present

## 2016-03-23 DIAGNOSIS — M654 Radial styloid tenosynovitis [de Quervain]: Secondary | ICD-10-CM | POA: Diagnosis not present

## 2016-04-18 ENCOUNTER — Encounter: Payer: Self-pay | Admitting: Neurology

## 2016-04-18 ENCOUNTER — Ambulatory Visit (INDEPENDENT_AMBULATORY_CARE_PROVIDER_SITE_OTHER): Payer: PPO | Admitting: Neurology

## 2016-04-18 VITALS — BP 140/92 | HR 96 | Resp 18 | Ht 66.0 in | Wt 164.0 lb

## 2016-04-18 DIAGNOSIS — F418 Other specified anxiety disorders: Secondary | ICD-10-CM | POA: Diagnosis not present

## 2016-04-18 DIAGNOSIS — G35 Multiple sclerosis: Secondary | ICD-10-CM | POA: Diagnosis not present

## 2016-04-18 DIAGNOSIS — R5383 Other fatigue: Secondary | ICD-10-CM

## 2016-04-18 DIAGNOSIS — R269 Unspecified abnormalities of gait and mobility: Secondary | ICD-10-CM

## 2016-04-18 DIAGNOSIS — N39498 Other specified urinary incontinence: Secondary | ICD-10-CM | POA: Diagnosis not present

## 2016-04-18 DIAGNOSIS — M4802 Spinal stenosis, cervical region: Secondary | ICD-10-CM | POA: Diagnosis not present

## 2016-04-18 MED ORDER — CLONAZEPAM 0.5 MG PO TABS: 0.5000 mg | ORAL_TABLET | Freq: Two times a day (BID) | ORAL | 5 refills | Status: DC | PRN
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
## 2016-04-28 DIAGNOSIS — H6123 Impacted cerumen, bilateral: Secondary | ICD-10-CM | POA: Diagnosis not present

## 2016-05-11 ENCOUNTER — Ambulatory Visit (INDEPENDENT_AMBULATORY_CARE_PROVIDER_SITE_OTHER): Payer: PPO

## 2016-05-11 ENCOUNTER — Ambulatory Visit (INDEPENDENT_AMBULATORY_CARE_PROVIDER_SITE_OTHER): Payer: PPO | Admitting: Podiatry

## 2016-05-11 ENCOUNTER — Encounter: Payer: Self-pay | Admitting: Podiatry

## 2016-05-11 VITALS — Resp 16 | Ht 66.0 in | Wt 170.0 lb

## 2016-05-11 DIAGNOSIS — M2010 Hallux valgus (acquired), unspecified foot: Secondary | ICD-10-CM

## 2016-05-11 DIAGNOSIS — M21629 Bunionette of unspecified foot: Secondary | ICD-10-CM

## 2016-05-11 NOTE — Progress Notes (Signed)
   Subjective:    Patient ID: Nancy Byrd, female    DOB: Aug 31, 1965, 51 y.o.   MRN: [id number]  HPI Chief Complaint  Patient presents with  . Foot Pain    Bilateral; bunion; pt stated, "Left foot hurts more than right foot"; x4 yrs  . Callouses    Bilateral; medial side-below great toe      Review of Systems  Constitutional: Positive for fatigue.  Eyes: Positive for visual disturbance.  Musculoskeletal: Positive for arthralgias, back pain and gait problem.  Allergic/Immunologic: Positive for food allergies.  Neurological: Positive for tremors and headaches.  All other systems reviewed and are negative.      Objective:   Physical Exam        Assessment & Plan:

## 2016-05-11 NOTE — Patient Instructions (Signed)
Pre-Operative Instructions  Congratulations, you have decided to take an important step to improving your quality of life.  You can be assured that the doctors of [facility] will be with you every step of the way.  1. Plan to be at the surgery center/hospital at least 1 (one) hour prior to your scheduled time unless otherwise directed by the surgical center/hospital staff.  You must have a responsible adult accompany you, remain during the surgery and drive you home.  Make sure you have directions to the surgical center/hospital and know how to get there on time. 2. For hospital based surgery you will need to obtain a history and physical form from your family physician within 1 month prior to the date of surgery- we will give you a form for you primary physician.  3. We make every effort to accommodate the date you request for surgery.  There are however, times where surgery dates or times have to be moved.  We will contact you as soon as possible if a change in schedule is required.   4. No Aspirin/Ibuprofen for one week before surgery.  If you are on aspirin, any non-steroidal anti-inflammatory medications (Mobic, Aleve, Ibuprofen) you should stop taking it 7 days prior to your surgery.  You make take Tylenol  For pain prior to surgery.  5. Medications- If you are taking daily heart and blood pressure medications, seizure, reflux, allergy, asthma, anxiety, pain or diabetes medications, make sure the surgery center/hospital is aware before the day of surgery so they may notify you which medications to take or avoid the day of surgery. 6. No food or drink after midnight the night before surgery unless directed otherwise by surgical center/hospital staff. 7. No alcoholic beverages 24 hours prior to surgery.  No smoking 24 hours prior to or 24 hours after surgery. 8. Wear loose pants or shorts- loose enough to fit over bandages, boots, and casts. 9. No slip on shoes, sneakers are best. 10. Bring  your boot with you to the surgery center/hospital.  Also bring crutches or a walker if your physician has prescribed it for you.  If you do not have this equipment, it will be provided for you after surgery. 11. If you have not been contracted by the surgery center/hospital by the day before your surgery, call to confirm the date and time of your surgery. 12. Leave-time from work may vary depending on the type of surgery you have.  Appropriate arrangements should be made prior to surgery with your employer. 13. Prescriptions will be provided immediately following surgery by your doctor.  Have these filled as soon as possible after surgery and take the medication as directed. 14. Remove nail polish on the operative foot. 15. Wash the night before surgery.  The night before surgery wash the foot and leg well with the antibacterial soap provided and water paying special attention to beneath the toenails and in between the toes.  Rinse thoroughly with water and dry well with a towel.  Perform this wash unless told not to do so by your physician.  Enclosed: 1 Ice pack (please put in freezer the night before surgery)   1 Hibiclens skin cleaner   Pre-op Instructions  If you have any questions regarding the instructions, do not hesitate to call our office.  [place]: [address] [place], [place] [address] [phone]  [place]: [address] [place], [place] [address] [phone]  [place]: [address]  , [place] [address] [phone]   Dr.   Danett Palazzo DPM, Dr. Matthew Wagoner DPM, Dr. M. Todd Hyatt DPM, Dr. Titorya Stover DPM 

## 2016-05-13 NOTE — Progress Notes (Signed)
Subjective:     Patient ID: Nancy Byrd, female   DOB: 05-14-1965, 51 y.o.   MRN: [id number]  HPI patient presents stating she has very tender big toe joint left over right and fifth metatarsal left over right. States that it's been red and makes it hard for her to wear shoe gear appropriately and she's tried wider shoes she's tried range of motion exercises and soaks without relief   Review of Systems  All other systems reviewed and are negative.      Objective:   Physical Exam  Constitutional: She is oriented to person, place, and time.  Cardiovascular: Intact distal pulses.   Musculoskeletal: Normal range of motion.  Neurological: She is oriented to person, place, and time.  Skin: Skin is warm.  Nursing note and vitals reviewed.  neurovascular status intact muscle strength was adequate range of motion within normal limits with patient noted to have hyperostosis medial aspect first metatarsal left over right and hyperostosis fifth metatarsal bilateral. 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 She is motivated to have this left one fixed and new that she wanted this prior to coming today and she wants to do consent form today as she needs to get it done as soon as possible and she is educated. I went ahead today and allowed her to read consent form going over procedure alternative treatments that are available and patient understands all risk as listed and signs consent form and is given all preoperative instructions and was dispensed air fracture walker that I want her to get used to wearing at this time. Patient is encouraged to call with any follow-up questions and understands recovery can take 6 months to one year for this type  procedure  X-ray report indicates that there is structural bunion deformity left over right and Taylor's bunion deformity bilateral with soft tissue irritation around bone structure

## 2016-05-16 DIAGNOSIS — M797 Fibromyalgia: Secondary | ICD-10-CM | POA: Diagnosis not present

## 2016-05-16 DIAGNOSIS — I7300 Raynaud's syndrome without gangrene: Secondary | ICD-10-CM | POA: Diagnosis not present

## 2016-05-16 DIAGNOSIS — K623 Rectal prolapse: Secondary | ICD-10-CM | POA: Diagnosis not present

## 2016-05-16 DIAGNOSIS — F331 Major depressive disorder, recurrent, moderate: Secondary | ICD-10-CM | POA: Diagnosis not present

## 2016-05-16 DIAGNOSIS — K219 Gastro-esophageal reflux disease without esophagitis: Secondary | ICD-10-CM | POA: Diagnosis not present

## 2016-05-16 DIAGNOSIS — Z6829 Body mass index (BMI) 29.0-29.9, adult: Secondary | ICD-10-CM | POA: Diagnosis not present

## 2016-05-16 DIAGNOSIS — G35 Multiple sclerosis: Secondary | ICD-10-CM | POA: Diagnosis not present

## 2016-05-16 DIAGNOSIS — F900 Attention-deficit hyperactivity disorder, predominantly inattentive type: Secondary | ICD-10-CM | POA: Diagnosis not present

## 2016-05-17 ENCOUNTER — Telehealth: Payer: Self-pay | Admitting: *Deleted

## 2016-05-17 NOTE — Telephone Encounter (Signed)
"  I am scheduled for surgery next Tuesday with Dr. Paulla Dolly.  One thing we did not discuss is that I have a heart murmur.  Even when I go to the dentist I get an antibiotic.  I had neck surgery before and they put me on an IV Vancomycin before I went into the OR.  I want to make sure he knew about my heart murmur.  I know things get overlooked sometime.  I am not sure if mama will be in tomorrow because she still has Pneumonia.  If not I'll see him Tuesday.

## 2016-05-18 NOTE — Telephone Encounter (Signed)
She will receive iv dose before surgery

## 2016-05-20 NOTE — Telephone Encounter (Signed)
I am returning your call.  I just wanted to let you know Dr. Paulla Dolly said he will give you an antibiotic at the surgical center prior to surgery.  "Okay, I am just paranoid.  I don't want anything to happen."  I understand.

## 2016-05-23 ENCOUNTER — Telehealth: Payer: Self-pay | Admitting: *Deleted

## 2016-05-23 NOTE — Telephone Encounter (Signed)
"  I'm calling to let you know that Nancy Byrd called and canceled her surgery.  She's sick.  She wants you to call her to reschedule."  I will give her a call.  I left patient a message that I was got the message that she was canceling surgery due to illness on tomorrow.  I informed her that I was going to reschedule her to April 10 if that date is not good give [place] a call tomorrow.

## 2016-05-24 NOTE — Telephone Encounter (Signed)
I will go ahead and reschedule her post-op appointment(s) for 10 April tentative surgery. Thank you for letting me know.

## 2016-06-02 ENCOUNTER — Other Ambulatory Visit: Payer: PPO

## 2016-06-07 ENCOUNTER — Encounter: Payer: Self-pay | Admitting: Podiatry

## 2016-06-07 DIAGNOSIS — M2012 Hallux valgus (acquired), left foot: Secondary | ICD-10-CM | POA: Diagnosis not present

## 2016-06-07 DIAGNOSIS — M21542 Acquired clubfoot, left foot: Secondary | ICD-10-CM | POA: Diagnosis not present

## 2016-06-07 DIAGNOSIS — K219 Gastro-esophageal reflux disease without esophagitis: Secondary | ICD-10-CM | POA: Diagnosis not present

## 2016-06-07 DIAGNOSIS — M21611 Bunion of right foot: Secondary | ICD-10-CM | POA: Diagnosis not present

## 2016-06-07 DIAGNOSIS — M25775 Osteophyte, left foot: Secondary | ICD-10-CM | POA: Diagnosis not present

## 2016-06-07 DIAGNOSIS — M21622 Bunionette of left foot: Secondary | ICD-10-CM | POA: Diagnosis not present

## 2016-06-15 ENCOUNTER — Ambulatory Visit (INDEPENDENT_AMBULATORY_CARE_PROVIDER_SITE_OTHER): Payer: PPO

## 2016-06-15 ENCOUNTER — Encounter: Payer: Self-pay | Admitting: Podiatry

## 2016-06-15 ENCOUNTER — Ambulatory Visit (INDEPENDENT_AMBULATORY_CARE_PROVIDER_SITE_OTHER): Payer: PPO | Admitting: Podiatry

## 2016-06-15 VITALS — BP 136/87 | HR 71 | Temp 98.7°F | Resp 16

## 2016-06-15 DIAGNOSIS — M2010 Hallux valgus (acquired), unspecified foot: Secondary | ICD-10-CM

## 2016-06-15 NOTE — Progress Notes (Signed)
Subjective:     Patient ID: Nancy Byrd, female   DOB: 1965-06-29, 51 y.o.   MRN: [id number]  HPI patient states she's doing real well with minimal discomfort and states that she's been able to walk with her boot   Review of Systems     Objective:   Physical Exam Neurovascular status intact negative Homans sign noted with well coapted incision sites first and fifth metatarsal and good alignment of the hallux with reduction of deformity noted bilateral that the patient is thrilled about    Assessment:     Doing well post osteotomy first fifth metatarsal    Plan:     H&P x-rays reviewed with patient and advised on continued complete compression and elevation and immobilization. Patient will be seen back 3 months and I did discuss at one point she may have to have one of her pins removed if it becomes irritated  X-rays indicate osteotomies are healing well with screw pins in place with slight possibility of elongated proximal pin in the left first metatarsal but I'll have to evaluated during postoperative period to see if it will become any kind of a problem at this point is not bothering her at all

## 2016-07-04 ENCOUNTER — Ambulatory Visit (INDEPENDENT_AMBULATORY_CARE_PROVIDER_SITE_OTHER): Payer: PPO | Admitting: Podiatry

## 2016-07-04 ENCOUNTER — Ambulatory Visit (INDEPENDENT_AMBULATORY_CARE_PROVIDER_SITE_OTHER): Payer: PPO

## 2016-07-04 DIAGNOSIS — M2010 Hallux valgus (acquired), unspecified foot: Secondary | ICD-10-CM | POA: Diagnosis not present

## 2016-07-04 DIAGNOSIS — R600 Localized edema: Secondary | ICD-10-CM

## 2016-07-06 NOTE — Progress Notes (Signed)
Subjective:    Patient ID: Nancy Byrd, female   DOB: 51 y.o.   MRN: [id number]   HPI patient presents concerned she had some swelling in her foot and she does not remember injury and she just wanted to make sure that nothing bad is going on    ROS      Objective:  Physical Exam Neurovascular status intact negative Homans sign was noted with forefoot edema left +1 pitting and no edema above the ankle left    Assessment:     Probability for overdoing it creating a swelling mechanism in the left foot    Plan:    X-ray reviewed and at this point applied Unna boot Ace wrap and advised on elevation. Reappoint to recheck  X-ray indicates he osteotomy is healing fell fifth metatarsals healing well with pin screw in place and no indications of movement

## 2016-07-08 ENCOUNTER — Other Ambulatory Visit: Payer: PPO

## 2016-07-18 NOTE — Progress Notes (Signed)
DOS 06/07/2016 [place] Bunionectomy (cutting and moving bone) with pin fixation(left): Tailors Bunionectomy with screw 5th(left)

## 2016-07-27 ENCOUNTER — Ambulatory Visit (INDEPENDENT_AMBULATORY_CARE_PROVIDER_SITE_OTHER): Payer: PPO | Admitting: Podiatry

## 2016-07-27 ENCOUNTER — Ambulatory Visit (INDEPENDENT_AMBULATORY_CARE_PROVIDER_SITE_OTHER): Payer: PPO

## 2016-07-27 DIAGNOSIS — M21629 Bunionette of unspecified foot: Secondary | ICD-10-CM | POA: Diagnosis not present

## 2016-07-27 DIAGNOSIS — M2010 Hallux valgus (acquired), unspecified foot: Secondary | ICD-10-CM

## 2016-07-27 DIAGNOSIS — M722 Plantar fascial fibromatosis: Secondary | ICD-10-CM

## 2016-07-27 DIAGNOSIS — Z472 Encounter for removal of internal fixation device: Secondary | ICD-10-CM

## 2016-07-27 MED ORDER — TRIAMCINOLONE ACETONIDE 10 MG/ML IJ SUSP
10.0000 mg | Freq: Once | INTRAMUSCULAR | Status: AC
  Administered 2016-07-27: 10 mg

## 2016-07-27 NOTE — Progress Notes (Signed)
Subjective:    Patient ID: Nancy Byrd, female   DOB: 51 y.o.   MRN: [id number]   HPI patient states she's doing well with her surgery but she's getting some sharp pain underneath at times and also her right heel has become very tender    ROS      Objective:  Physical Exam neurovascular status intact negative Homans sign was noted with patient noted to have well-healing surgical site left first metatarsal with good range of motion and no irritation when I did that with mild plantar discomfort around the first metatarsal and discomfort in the medial and and central band of the right plantar fashion     Assessment:     Possibility for abnormal pin position left versus healing structural correction and plantar fasciitis right     Plan:    H&P conditions reviewed and today I went ahead for the left and I applied dancer's pad to take pressure off the plantar metatarsal and advised on return to shoe gear and if symptoms persist we will need to consider pin removal and for the right heel I injected the plantar fascia 3 mg Kenalog 5 mg Xylocaine which was tolerated well  X-rays indicate that the osteotomies healing well left with pins in place with possibility for proximal pin may have seeded a little bit deeply and may be creating plantar irritation

## 2016-08-10 DIAGNOSIS — R300 Dysuria: Secondary | ICD-10-CM | POA: Diagnosis not present

## 2016-08-10 DIAGNOSIS — Z9189 Other specified personal risk factors, not elsewhere classified: Secondary | ICD-10-CM | POA: Diagnosis not present

## 2016-08-10 DIAGNOSIS — K210 Gastro-esophageal reflux disease with esophagitis: Secondary | ICD-10-CM | POA: Diagnosis not present

## 2016-08-10 DIAGNOSIS — E876 Hypokalemia: Secondary | ICD-10-CM | POA: Diagnosis not present

## 2016-08-10 DIAGNOSIS — M1990 Unspecified osteoarthritis, unspecified site: Secondary | ICD-10-CM | POA: Diagnosis not present

## 2016-08-10 DIAGNOSIS — M654 Radial styloid tenosynovitis [de Quervain]: Secondary | ICD-10-CM | POA: Diagnosis not present

## 2016-08-10 DIAGNOSIS — F900 Attention-deficit hyperactivity disorder, predominantly inattentive type: Secondary | ICD-10-CM | POA: Diagnosis not present

## 2016-08-12 DIAGNOSIS — G35 Multiple sclerosis: Secondary | ICD-10-CM | POA: Diagnosis not present

## 2016-08-12 DIAGNOSIS — F900 Attention-deficit hyperactivity disorder, predominantly inattentive type: Secondary | ICD-10-CM | POA: Diagnosis not present

## 2016-08-12 DIAGNOSIS — M797 Fibromyalgia: Secondary | ICD-10-CM | POA: Diagnosis not present

## 2016-08-12 DIAGNOSIS — F331 Major depressive disorder, recurrent, moderate: Secondary | ICD-10-CM | POA: Diagnosis not present

## 2016-08-12 DIAGNOSIS — N3000 Acute cystitis without hematuria: Secondary | ICD-10-CM | POA: Diagnosis not present

## 2016-08-12 DIAGNOSIS — I7300 Raynaud's syndrome without gangrene: Secondary | ICD-10-CM | POA: Diagnosis not present

## 2016-08-12 DIAGNOSIS — Z6829 Body mass index (BMI) 29.0-29.9, adult: Secondary | ICD-10-CM | POA: Diagnosis not present

## 2016-08-12 DIAGNOSIS — K623 Rectal prolapse: Secondary | ICD-10-CM | POA: Diagnosis not present

## 2016-08-17 DIAGNOSIS — E876 Hypokalemia: Secondary | ICD-10-CM | POA: Diagnosis not present

## 2016-08-24 ENCOUNTER — Other Ambulatory Visit (HOSPITAL_COMMUNITY): Payer: Self-pay | Admitting: Family Medicine

## 2016-08-24 DIAGNOSIS — Z1231 Encounter for screening mammogram for malignant neoplasm of breast: Secondary | ICD-10-CM

## 2016-08-25 ENCOUNTER — Encounter: Payer: PPO | Admitting: Podiatry

## 2016-08-29 ENCOUNTER — Ambulatory Visit (HOSPITAL_COMMUNITY): Payer: PPO

## 2016-09-05 ENCOUNTER — Ambulatory Visit (HOSPITAL_COMMUNITY)
Admission: RE | Admit: 2016-09-05 | Discharge: 2016-09-05 | Disposition: A | Discharge status: Home / Self Care | Payer: PPO | Source: Ambulatory Visit | Attending: Family Medicine | Admitting: Family Medicine

## 2016-09-05 DIAGNOSIS — Z1231 Encounter for screening mammogram for malignant neoplasm of breast: Secondary | ICD-10-CM | POA: Insufficient documentation

## 2016-09-06 ENCOUNTER — Other Ambulatory Visit (HOSPITAL_COMMUNITY): Payer: Self-pay | Admitting: Family Medicine

## 2016-09-06 DIAGNOSIS — N6310 Unspecified lump in the right breast, unspecified quadrant: Secondary | ICD-10-CM

## 2016-09-08 ENCOUNTER — Ambulatory Visit (INDEPENDENT_AMBULATORY_CARE_PROVIDER_SITE_OTHER): Payer: PPO | Admitting: Podiatry

## 2016-09-08 ENCOUNTER — Ambulatory Visit: Payer: PPO

## 2016-09-08 ENCOUNTER — Ambulatory Visit (INDEPENDENT_AMBULATORY_CARE_PROVIDER_SITE_OTHER): Payer: PPO

## 2016-09-08 ENCOUNTER — Encounter: Payer: Self-pay | Admitting: Podiatry

## 2016-09-08 DIAGNOSIS — M2012 Hallux valgus (acquired), left foot: Secondary | ICD-10-CM | POA: Diagnosis not present

## 2016-09-08 DIAGNOSIS — Z472 Encounter for removal of internal fixation device: Secondary | ICD-10-CM | POA: Diagnosis not present

## 2016-09-08 DIAGNOSIS — M2042 Other hammer toe(s) (acquired), left foot: Secondary | ICD-10-CM

## 2016-09-09 NOTE — Progress Notes (Signed)
Subjective:    Patient ID: Nancy Byrd, female   DOB: 51 y.o.   MRN: [id number]   HPI patient states that overall she is doing well but she gets occasional sharp pains in her left first metatarsal and she's not sure where it comes from and she still has some swelling but overall seems to be healing well    ROS      Objective:  Physical Exam neurovascular status intact with good range of motion with no crepitus of the joint or pain when I dorsi and plantarflex the big toe. I was not able to palpate plantar pain currently but she points to the plantar metatarsal and first interspace and states that's where she feels the sharp pain. Wound edges are coapted well and mild edema noted     Assessment:   Overall doing well with possibility that the proximal pin may be irritating the plantar metatarsal as it is in slightly deep and may be causing occasional irritation      Plan:     H&P and x-rays reviewed with patient. At this point I do think pin removal can be done his bone healing has occurred and I explained procedure and allowed her to read consent form going over alternative treatments complications. I did explain there is absolutely no long-term guarantees this result whatever pain she has been I am hopeful and she wants procedure which will be done in the office and she signs consent form and will have this performed in the next few weeks. At this time she will continue with soft bottom shoes range of motion exercises and is scheduled for outpatient surgery in the office  X-ray report indicates that the osteotomy has healed well with secondary healing fifth metatarsal but it is healing at this position and should heal uneventfully. The pin in the first metatarsal is slightly long and may be creating plantar irritation which was result discussed with patient

## 2016-09-14 DIAGNOSIS — H2513 Age-related nuclear cataract, bilateral: Secondary | ICD-10-CM | POA: Diagnosis not present

## 2016-09-14 DIAGNOSIS — H5203 Hypermetropia, bilateral: Secondary | ICD-10-CM | POA: Diagnosis not present

## 2016-09-14 DIAGNOSIS — H524 Presbyopia: Secondary | ICD-10-CM | POA: Diagnosis not present

## 2016-09-19 ENCOUNTER — Telehealth: Payer: Self-pay | Admitting: *Deleted

## 2016-09-19 NOTE — Telephone Encounter (Signed)
"  I'm calling to get a diagnosis code for the authorization request for surgery.  Please give me a call."  I am returning your call.  The ICD-10 is Z48.89.  "That code is not pulling up anything."  Try Z47.2.  "That brings up encounter for removal of hardware device.  Do you want to use that one?"  Yes, use that one.

## 2016-09-20 ENCOUNTER — Ambulatory Visit (HOSPITAL_COMMUNITY)
Admission: RE | Admit: 2016-09-20 | Discharge: 2016-09-20 | Disposition: A | Discharge status: Home / Self Care | Payer: PPO | Source: Ambulatory Visit | Attending: Family Medicine | Admitting: Family Medicine

## 2016-09-20 DIAGNOSIS — R928 Other abnormal and inconclusive findings on diagnostic imaging of breast: Secondary | ICD-10-CM | POA: Insufficient documentation

## 2016-09-20 DIAGNOSIS — N6310 Unspecified lump in the right breast, unspecified quadrant: Secondary | ICD-10-CM

## 2016-10-03 ENCOUNTER — Encounter: Payer: Self-pay | Admitting: Podiatry

## 2016-10-03 ENCOUNTER — Ambulatory Visit (INDEPENDENT_AMBULATORY_CARE_PROVIDER_SITE_OTHER): Payer: PPO | Admitting: Podiatry

## 2016-10-03 VITALS — BP 148/85 | HR 77 | Temp 98.5°F | Resp 16

## 2016-10-03 DIAGNOSIS — Z472 Encounter for removal of internal fixation device: Secondary | ICD-10-CM | POA: Diagnosis not present

## 2016-10-03 NOTE — Progress Notes (Signed)
Subjective:    Patient ID: Nancy Byrd, female   DOB: 51 y.o.   MRN: [id number]   HPI patient presents for removal of pin left having some irritation in the plantar aspect left first metatarsal    ROS      Objective:  Physical Exam neurovascular status intact with an area of irritation plantar aspect left first metatarsal that's localized with incision that's healing well and good range of motion first MPJ with no crepitus     Assessment:   Doing well overall with irritated plantar area left first metatarsal      Plan:    Patient was brought to the OR injected with 120 mg like Marcaine mixture. Under sterile conditions after the tourniquet was inflated to 250 mmHg the following procedure was performed. Attention was directed to the dorsal aspect first metatarsal where an approximate 2 cm incision was made over the offending pin. The incision was deepened through Subcutaneous Tissues Tissue and through Subcutaneous Tissue down to Capsule with Hemostasis Being Acquired As Necessary. The Pin Was Then Identified Was Freed from All Surrounding Soft Tissue Attachments Removed In Toto and the Wound Was Flushed. States Stitches of 5-0 Nylon Were Applied and the Patient Had Sterile Dressing Applied to the Left Foot. Patient Left the OR in Satisfactory Condition with Digital Perfusion Good All Toes

## 2016-10-17 ENCOUNTER — Ambulatory Visit (INDEPENDENT_AMBULATORY_CARE_PROVIDER_SITE_OTHER): Payer: PPO | Admitting: Neurology

## 2016-10-17 ENCOUNTER — Encounter: Payer: Self-pay | Admitting: Neurology

## 2016-10-17 ENCOUNTER — Ambulatory Visit: Payer: Self-pay | Admitting: Podiatry

## 2016-10-17 ENCOUNTER — Ambulatory Visit (INDEPENDENT_AMBULATORY_CARE_PROVIDER_SITE_OTHER): Payer: PPO

## 2016-10-17 ENCOUNTER — Encounter: Payer: Self-pay | Admitting: Podiatry

## 2016-10-17 VITALS — BP 126/86 | HR 68 | Resp 16

## 2016-10-17 VITALS — BP 124/79 | HR 63 | Resp 16 | Ht 66.0 in | Wt 178.5 lb

## 2016-10-17 DIAGNOSIS — Z472 Encounter for removal of internal fixation device: Secondary | ICD-10-CM

## 2016-10-17 DIAGNOSIS — R5383 Other fatigue: Secondary | ICD-10-CM

## 2016-10-17 DIAGNOSIS — K219 Gastro-esophageal reflux disease without esophagitis: Secondary | ICD-10-CM | POA: Diagnosis not present

## 2016-10-17 DIAGNOSIS — F418 Other specified anxiety disorders: Secondary | ICD-10-CM | POA: Diagnosis not present

## 2016-10-17 DIAGNOSIS — G35 Multiple sclerosis: Secondary | ICD-10-CM | POA: Diagnosis not present

## 2016-10-17 DIAGNOSIS — R269 Unspecified abnormalities of gait and mobility: Secondary | ICD-10-CM | POA: Diagnosis not present

## 2016-10-17 DIAGNOSIS — N39498 Other specified urinary incontinence: Secondary | ICD-10-CM | POA: Diagnosis not present

## 2016-10-17 DIAGNOSIS — M79605 Pain in left leg: Secondary | ICD-10-CM | POA: Diagnosis not present

## 2016-10-17 DIAGNOSIS — G250 Essential tremor: Secondary | ICD-10-CM

## 2016-10-17 MED ORDER — DULOXETINE HCL 60 MG PO CPEP: 120.0000 mg | ORAL_CAPSULE | Freq: Every day | ORAL | 11 refills | Status: AC

## 2016-10-17 MED ORDER — RABEPRAZOLE SODIUM 20 MG PO TBEC: 20.0000 mg | DELAYED_RELEASE_TABLET | Freq: Every day | ORAL | 11 refills | Status: DC

## 2016-10-17 MED ORDER — PROPRANOLOL HCL ER 80 MG PO CP24: 80.0000 mg | ORAL_CAPSULE | Freq: Every day | ORAL | 11 refills | Status: DC

## 2016-10-17 NOTE — Progress Notes (Signed)
[facility]  PATIENT: Nancy Byrd DOB: 1965/11/14  REFERRING DOCTOR OR PCP:  Gar Ponto  Fax: [phone] SOURCE: patient, and records and MRI images on PACS  _________________________________   HISTORICAL  CHIEF COMPLAINT:  Chief Complaint  Patient presents with  . Multiple Sclerosis    Remains off of MS dmt. Sts. head tremors are worse. Since last ov, she's had left foot. surgery.Hilton Cork    HISTORY OF PRESENT ILLNESS:  Nancy Byrd is a 51 yo woman with MS.      MS:  She remains off any DMT and feels mostly stable.   The MRI of the brain 03/2015 was unchanged compared to 2008.    She did feel worse when she had an E. Coli UTI.    MRI cervical spine showed multilevel DJD with moderate spinal stenosis at C5C6 and mild spinal stenosis at C4C5 but no demyelinating lesions or myelopathic signal.        Gait/strength/sensation:  She continues to have difficulty with her gait. Balance is off and her right leg drags when she is tired. She falls every month or 2.   She can walk without a cane but used to use one (or walker at times).  She has mild weakness and spasticity in her legs, right > left.   She notes no numbness. She has had a tremor in her arms the past year.     Tremor:   She has a head tremor more than the head.   The head tremor 'drives her nuts'.   Propranolol 60 mg daily has helped the tremor some.  She never filled the clonazepam.     She has a strong FH.  Nadolol helped better than propranolol but caused low BP.     Vision:  She has no major difficulties with her vision. There is no diplopia.  Bladder:   She has bladder frequency and incontinence helped most by Myrbetriq.    Fatigue/sleep: She still has a lot of fatigue.  She has some benefit from Adderall XR 30 mg twice a day.    She has trouble falling asleep and has 3 or 4 bad nights ina row and then sleeps > 10 hours the next night.     She notes restless leg syndrome and takes ropinirole with  mild benefit only.  Mood/cognition:   She has more depression, despite duloxetine 120 mg daily and has had irritable and has crying spells.   She also has some anxiety. She also has had difficulties with cognition. She notes poor focus, reduced executive function, verbal fluency and short-term memory.    Other:  She had foot surgery in April(left) and had compkications requiring a second operation.    She had fusion surgery by Dr. Joya Salm September 2017 for multilevel spinal stenosis at C3-C6.     MS History:    She was diagnosed in 2004 after presenting with dizziness. At that time, she was being treated with Enbrel for her rheumatoid arthritis. She had an MRI and lumbar puncture confirming the diagnosis. She also had several episodes of optic neuritis. She was treated with steroids for her initial symptoms of the optic neuritis. She was placed on Rebif. She was on Rebif for about a year but then stopped due to severe side effects. She reports having an anaphylactic reaction and be hospitalized and having a cardiac workup. She stopped the Rebif and has not gone any disease modifying therapy since that time, around 2007.   She  has seen several neurologists including Dr. Jannifer Franklin, Dr. Jacqulynn Cadet and Dr. Otelia Sergeant [facility]).    She reports that Tysabri was discussed with her but due to potential of PML she opted not to go on the medication. Her last MRI of the brain is from 2009. I personally reviewed the images. She has several T2/FLAIR hyperintense foci including some in the periventricular white matter. The MRIs of the cervical and thoracic spine from 2011 show some degenerative changes but did not show any MS changes.  Repeat brain MRI 03/20/2015 did not show any significant changes. Repeat cervical spine MRI 03/20/2015 to not show any spinal cord lesions. She has mild spinal stenosis at C4-C5 with possible impingement of the left C5 nerve root. She has moderate spinal stenosis at C5-C6 with possible right C6  nerve root impingement. The C5C6 level have progressed appeared to her previous MRI.   Her dad had MS.    REVIEW OF SYSTEMS: Constitutional: No fevers, chills, sweats, or change in appetite.   She has fatigue and poor sleep. She has restless leg syndrome. Eyes: No visual changes, double vision, eye pain Ear, nose and throat: No hearing loss, ear pain, nasal congestion, sore throat Cardiovascular: No chest pain, palpitations Respiratory: No shortness of breath at rest or with exertion.   No wheezes GastrointestinaI: No nausea, vomiting, diarrhea, abdominal pain, fecal incontinence Genitourinary: as above. Musculoskeletal: Reports some muscle and joint pain Integumentary: No rash, pruritus, skin lesions Neurological: as above Psychiatric: Notes depression and anxiety Endocrine: No palpitations, diaphoresis, change in appetite, change in weigh or increased thirst Hematologic/Lymphatic: No anemia, purpura, petechiae. Allergic/Immunologic: No itchy/runny eyes, nasal congestion, recent allergic reactions, rashes  ALLERGIES: Allergies  Allergen Reactions  . Enbrel [Etanercept] Anaphylaxis  . Interferon Beta-1a Anaphylaxis  . Other Anaphylaxis    rebiff Bee stings  . Betadine [Povidone Iodine]   . Shellfish Allergy   . Tdap [Diphth-Acell Pertussis-Tetanus]     Vomiting, nausea, dizzy  . Amoxicillin Rash  . Iohexol Rash     Desc: ANAPHYLACTIC REACTION TO IODINE  Desc: ANAPHYLACTIC REACTION TO IODINE     HOME MEDICATIONS:  Current Outpatient Prescriptions:  .  acetaminophen (TYLENOL) 500 MG tablet, Take 1,000 mg by mouth every 8 (eight) hours as needed., Disp: , Rfl:  .  aluminum hydroxide-magnesium carbonate (GAVISCON) 95-358 MG/15ML SUSP, Take by mouth as needed., Disp: , Rfl:  .  amphetamine-dextroamphetamine (ADDERALL XR) 30 MG 24 hr capsule, Take 30 mg by mouth daily. , Disp: , Rfl:  .  Ascorbic Acid (VITAMIN C) 1000 MG tablet, Take 1,000 mg by mouth daily., Disp: , Rfl:    .  Calcium Carbonate-Vitamin D (CALCIUM + D PO), Take 1,200 mg by mouth daily., Disp: , Rfl:  .  cetirizine (ZYRTEC) 10 MG tablet, Take 10 mg by mouth daily., Disp: , Rfl:  .  Cyanocobalamin (VITAMIN B-12 IJ), Inject as directed every 30 (thirty) days., Disp: , Rfl:  .  DULoxetine (CYMBALTA) 60 MG capsule, Take 2 capsules (120 mg total) by mouth daily., Disp: 60 capsule, Rfl: 11 .  EPINEPHrine 0.3 mg/0.3 mL IJ SOAJ injection, Inject 0.3 mg into the muscle once., Disp: , Rfl:  .  Flax OIL, Take 1,000 mg by mouth daily. Reported on 04/16/2015, Disp: , Rfl:  .  furosemide (LASIX) 20 MG tablet, Take 20 mg by mouth as needed., Disp: , Rfl:  .  gabapentin (NEURONTIN) 600 MG tablet, Take 600 mg by mouth 3 (three) times daily as needed. Reported on 04/16/2015,  Disp: , Rfl:  .  lactase (LACTAID) 3000 UNITS tablet, Take by mouth 3 (three) times daily with meals., Disp: , Rfl:  .  meclizine (ANTIVERT) 25 MG tablet, Take 25 mg by mouth 3 (three) times daily as needed for dizziness., Disp: , Rfl:  .  mirabegron ER (MYRBETRIQ) 50 MG TB24 tablet, Take 1 tablet (50 mg total) by mouth daily., Disp: 30 tablet, Rfl: 11 .  Modafinil (PROVIGIL PO), Take 600 mg by mouth. Per patient take 400 mg in the morning and 200 mg at noon po, Disp: , Rfl:  .  ondansetron (ZOFRAN) 4 MG tablet, Take 1 mg by mouth every 8 (eight) hours as needed for nausea or vomiting., Disp: , Rfl:  .  potassium chloride SA (K-DUR,KLOR-CON) 20 MEQ tablet, , Disp: , Rfl:  .  Probiotic Product (ALIGN PO), Take by mouth., Disp: , Rfl:  .  propranolol ER (INDERAL LA) 80 MG 24 hr capsule, Take 1 capsule (80 mg total) by mouth daily., Disp: 30 capsule, Rfl: 11 .  rOPINIRole (REQUIP) 0.25 MG tablet, Take 1 tablet (0.25 mg total) by mouth as needed., Disp: 90 tablet, Rfl: 5 .  SOYA LECITHIN PO, Take 1,200 mg by mouth daily., Disp: , Rfl:  .  topiramate (TOPAMAX) 200 MG tablet, Take 200 mg by mouth 2 (two) times daily., Disp: , Rfl:  .  UBIQUINOL PO, Take  by mouth., Disp: , Rfl:  .  zolmitriptan (ZOMIG) 5 MG tablet, Take 5 mg by mouth as needed for migraine., Disp: , Rfl:  .  RABEprazole (ACIPHEX) 20 MG tablet, Take 1 tablet (20 mg total) by mouth daily., Disp: 30 tablet, Rfl: 11  PAST MEDICAL HISTORY: Past Medical History:  Diagnosis Date  . Carpal tunnel syndrome    right hand  . Collagen vascular disease ([place])   . DDD (degenerative disc disease), cervical   . Depression   . GERD (gastroesophageal reflux disease)   . Heart murmur   . Hypercholesterolemia   . Lumbar disc herniation    L1-L2; L4-L5  . Migraines   . Multiple sclerosis ([place])   . Neuropathy   . Optic neuritis   . Pernicious anemia   . Rheumatoid arthritis ([place])    right hand  . Spinal stenosis   . Tenosynovitis     PAST SURGICAL HISTORY: Past Surgical History:  Procedure Laterality Date  . APPENDECTOMY  06/07/04  . CARDIAC CATHETERIZATION  06/13/06  . CHOLECYSTECTOMY  1989  . NASAL FRACTURE SURGERY     as a child  . TENOSYNOVECTOMY Bilateral 1985  . TONSILECTOMY, ADENOIDECTOMY, BILATERAL MYRINGOTOMY AND TUBES     as a child  . TOTAL ABDOMINAL HYSTERECTOMY  06/07/04    FAMILY HISTORY: Family History  Problem Relation Age of Onset  . Colon cancer Neg Hx   . Colon polyps Neg Hx     SOCIAL HISTORY:  Social History   Social History  . Marital status: Widowed    Spouse name: N/A  . Number of children: N/A  . Years of education: N/A   Occupational History  . Not on file.   Social History Main Topics  . Smoking status: Never Smoker  . Smokeless tobacco: Never Used  . Alcohol use No  . Drug use: No  . Sexual activity: Not on file   Other Topics Concern  . Not on file   Social History Narrative  . No narrative on file     PHYSICAL EXAM  Vitals:  10/17/16 1250  BP: 124/79  Pulse: 63  Resp: 16  Weight: 178 lb 8 oz (81 kg)  Height: 5\' 6"  (1.676 m)    Body mass index is 28.81 kg/m.   General: The patient is well-developed and  well-nourished and in no acute distress  Neck: The neck is supple.   The neck is nontender.  Musculoskeletal:  Her back is nontender today..  She has Nodules on hand and toe joints.  Neurologic Exam  Mental status: The patient is alert and oriented x 3 at the time of the examination. The patient has apparent normal recent and remote memory, with an apparently normal attention span and concentration ability.   Speech is normal.  Cranial nerves: Extraocular movements are full.  Facial strength and sensation is normal.  Trapezius and sternocleidomastoid strength is normal. No dysarthria is noted.  The tongue is midline, and the patient has symmetric elevation of the soft palate. No obvious hearing deficits are noted.  Motor:  She has a rapid low amplitude tremor in the hands and head. Tremor increases when she tries to stay still.   Muscle bulk is normal.   Tone is slightly increased in legs, worse on the left. Strength is  5 / 5 in all 4 extremities.   Sensory: Sensory testing is intact to touch and vibration sensation in all 4 extremities  Coordination: Cerebellar testing reveals good finger-nose-finger and mildly reduced left heel-to-shin bilaterally.  Gait and station: Station is normal.   Gait shows left foot drop. Tandem gait is wide. Romberg is negative.   Reflexes: Deep tendon reflexes are increased in legs, left > right.       DIAGNOSTIC DATA (LABS, IMAGING, TESTING) - I reviewed patient records, labs, notes, testing and imaging myself where available.  Lab Results  Component Value Date   WBC 5.3 02/18/2015   HGB 11.5 02/18/2015   HCT 36.1 02/18/2015   MCV 91 02/18/2015   PLT 291 01/06/2015       ASSESSMENT AND PLAN  Multiple sclerosis (HCC)  Gait disturbance  Other urinary incontinence  Depression with anxiety  Other fatigue  Leg pain, left  Essential tremor  Gastroesophageal reflux disease, esophagitis presence not specified   1.   She plans on  continuing off of any disease modifying therapy for the MS. Her last MRI was stable 2.   Aciphex for GERD     3,   Increase Inderal to 80 mg daily.   If not better add primidone and titrate,   4.   Continue Cymbalta and renew 120 mg daily..  5.   Continue Myrbetriq for the bladder.   She will continue Adderall for fatigue and attention deficit and she gets that from her primary care doctor.  6.   Return in 6 months or sooner if there are new or worsening neurologic symptoms.  42 minute face-to-face evaluation with greater than one half of the time counseling and coordinating care about her MS symptoms, tremors and mood issues.  Shamiah Kahler A. Felecia Shelling, MD, PhD 06/11/2438, 1:02 PM Certified in Neurology, Clinical Neurophysiology, Sleep Medicine, Pain Medicine and Neuroimaging  [facility] [address], [place], [place] [address] [phone]

## 2016-10-19 NOTE — Progress Notes (Signed)
Subjective:    Patient ID: Nancy Byrd, female   DOB: 51 y.o.   MRN: [id number]   HPI patient states she's doing so much better but she knows long-term she's getting need some form of inserts    ROS      Objective:  Physical Exam neurovascular status intact with patient found to have excellent healing from pin removal left with no more sharp plantar pain noted     Assessment:    Doing well post removal of pin left with patient having chronic tendinitis     Plan:     Discussed long-term orthotics and she'll be scanned for orthotics and today stitches were removed with wound edges well coapted and bandage applied. Reappoint for orthotic and will be seen back as needed

## 2016-10-20 DIAGNOSIS — Z6829 Body mass index (BMI) 29.0-29.9, adult: Secondary | ICD-10-CM | POA: Diagnosis not present

## 2016-10-20 DIAGNOSIS — B029 Zoster without complications: Secondary | ICD-10-CM | POA: Diagnosis not present

## 2016-11-07 ENCOUNTER — Other Ambulatory Visit: Payer: PPO | Admitting: Orthotics

## 2016-11-12 ENCOUNTER — Other Ambulatory Visit: Payer: Self-pay | Admitting: Neurology

## 2016-11-24 DIAGNOSIS — I8312 Varicose veins of left lower extremity with inflammation: Secondary | ICD-10-CM | POA: Diagnosis not present

## 2016-11-24 DIAGNOSIS — M79604 Pain in right leg: Secondary | ICD-10-CM | POA: Diagnosis not present

## 2016-11-24 DIAGNOSIS — M79605 Pain in left leg: Secondary | ICD-10-CM | POA: Diagnosis not present

## 2016-11-24 DIAGNOSIS — I8311 Varicose veins of right lower extremity with inflammation: Secondary | ICD-10-CM | POA: Diagnosis not present

## 2016-11-24 DIAGNOSIS — I83813 Varicose veins of bilateral lower extremities with pain: Secondary | ICD-10-CM | POA: Diagnosis not present

## 2016-12-06 DIAGNOSIS — I83813 Varicose veins of bilateral lower extremities with pain: Secondary | ICD-10-CM | POA: Diagnosis not present

## 2016-12-07 ENCOUNTER — Ambulatory Visit (INDEPENDENT_AMBULATORY_CARE_PROVIDER_SITE_OTHER): Payer: PPO

## 2016-12-07 ENCOUNTER — Encounter: Payer: Self-pay | Admitting: Podiatry

## 2016-12-07 ENCOUNTER — Ambulatory Visit (INDEPENDENT_AMBULATORY_CARE_PROVIDER_SITE_OTHER): Payer: PPO | Admitting: Podiatry

## 2016-12-07 VITALS — BP 130/77 | HR 66 | Resp 16

## 2016-12-07 DIAGNOSIS — R600 Localized edema: Secondary | ICD-10-CM | POA: Diagnosis not present

## 2016-12-07 DIAGNOSIS — M2012 Hallux valgus (acquired), left foot: Secondary | ICD-10-CM

## 2016-12-07 DIAGNOSIS — M779 Enthesopathy, unspecified: Secondary | ICD-10-CM

## 2016-12-07 MED ORDER — TRIAMCINOLONE ACETONIDE 10 MG/ML IJ SUSP
10.0000 mg | Freq: Once | INTRAMUSCULAR | Status: AC
  Administered 2016-12-07: 10 mg

## 2016-12-07 NOTE — Progress Notes (Signed)
Subjective:    Patient ID: Nancy Byrd, female   DOB: 51 y.o.   MRN: [id number]   HPI patient states she hurt top of her left foot and she's not sure what she may have done    ROS      Objective:  Physical Exam neurovascular status intact with areas we did surgery doing very well but quite a bit of discomfort with edema and fluid buildup around the extensor complex left midtarsal joint     Assessment:   Probability for inflammatory tendinitis left     Plan:  H&P x-ray reviewed and today I injected the dorsal complex 3 mg Kenalog 5 mill grams Xylocaine and due to edema applied Unna boot with Ace wrap and advised on surgical shoe usage. Reappoint to recheck again in 4 weeks or earlier if needed  Osteotomy indicates pin screw in place with no indications of pathology

## 2016-12-17 ENCOUNTER — Other Ambulatory Visit: Payer: Self-pay | Admitting: Sports Medicine

## 2016-12-17 MED ORDER — FLUCONAZOLE 150 MG PO TABS: 150.0000 mg | ORAL_TABLET | Freq: Once | ORAL | 0 refills | Status: AC

## 2016-12-17 MED ORDER — DOXYCYCLINE HYCLATE 100 MG PO TABS: 100.0000 mg | ORAL_TABLET | Freq: Two times a day (BID) | ORAL | 0 refills | Status: DC

## 2016-12-17 NOTE — Progress Notes (Signed)
Patient called answering service stating that she has had issues with her foot since last 3 weeks her foot has gotten worse. Reports she removed unna boot and since has had continued swelling, redness, and warmth to her foot. Denies any pain, reports that it feels like pressure. Denies any symptoms of acute DVT. Sent to pharmacy Doxycycline for possible foot infection/cellulitis and Diflucan to prevent yeast infection per patient history. Recommend patient to rest, ice, elevate, and continue with post op shoe. Recommend patient to call office or go to ER if symptoms fail to improve. Patient expressed understanding. -Dr. Cannon Kettle

## 2016-12-19 ENCOUNTER — Telehealth: Payer: Self-pay | Admitting: Podiatry

## 2016-12-19 NOTE — Telephone Encounter (Signed)
Informed pt to go back in to the surgical boot, and continue the antibiotic, and rest until seen. More information of phone message of 3:36pm.

## 2016-12-19 NOTE — Telephone Encounter (Signed)
Nancy Byrd called in at 2:15pm. She stated that she saw Dr. Paulla Dolly last week and he put a boot on her left foot, which is now off. She is complaining of internal pressure in her foot and feels like its going to explode. She is unable to walk on it. The on call Doctor prescribed her doxycycline. Ms, [name] needs to know what she needs to do next. She is scared she has an infection and wants a nurse to give her a call back as soon as possible.Marland Kitchen

## 2016-12-19 NOTE — Telephone Encounter (Addendum)
Due to network difficulties unable to contact pt on home phone. I spoke with pt and told her to continue the antibiotics, rest, go back in to the boot. I asked pt if the symptoms had changed from her last visit and pt stated yes. I told her that since there had been a change from last week he may x-ray again or possible MRI or CT as other options for visualization. I told pt to go back in to the surgical boot, and continue the antibiotic until seen on Thursday. Pt states understanding.

## 2016-12-19 NOTE — Telephone Encounter (Signed)
I spoke to Dr. Cannon Kettle, the on call doctor on Saturday. She recommended I take antibiotics and to give it 48 hours for my left foot as it is not better from the two surgeries I had by Dr. Paulla Dolly. She told me to call back after 48 hours which is what I'm doing and I want to know what the next steps are. Please call me back at home [phone] or my mobile [phone]. Thank you.

## 2016-12-22 ENCOUNTER — Encounter: Payer: Self-pay | Admitting: Podiatry

## 2016-12-22 ENCOUNTER — Ambulatory Visit (INDEPENDENT_AMBULATORY_CARE_PROVIDER_SITE_OTHER): Payer: PPO | Admitting: Podiatry

## 2016-12-22 ENCOUNTER — Ambulatory Visit (INDEPENDENT_AMBULATORY_CARE_PROVIDER_SITE_OTHER): Payer: PPO

## 2016-12-22 ENCOUNTER — Other Ambulatory Visit: Payer: Self-pay | Admitting: Podiatry

## 2016-12-22 DIAGNOSIS — M779 Enthesopathy, unspecified: Secondary | ICD-10-CM | POA: Diagnosis not present

## 2016-12-22 DIAGNOSIS — M79672 Pain in left foot: Secondary | ICD-10-CM

## 2016-12-22 NOTE — Telephone Encounter (Signed)
Faxed orders to [facility], and will fax required form, [place] 12/22/2016 when available with clinicals for 2018 to [employer].

## 2016-12-22 NOTE — Progress Notes (Signed)
Subjective:    Patient ID: Coy Saunas, female   DOB: 51 y.o.   MRN: [id number]   HPI patient states that she still having severe pain on top of the left foot and she did feel like there was a pop. The area we did the surgeries doing fine but she is worried that she has torn a tendon or broken something and it did not respond to the injection and immobilization    ROS      Objective:  Physical Exam neurovascular status is intact with excellent structural correction first MPJ fifth MPJ left with no pain surrounding these areas but quite a bit of midfoot pain left with pain also into the second and third metatarsal shafts     Assessment:    Very difficult to make a determination as to whether or not this is some kind of tendinitis-like condition or may be indicating a tear of the extensor tendon complex or possible bone injury     Plan:    X-rays taken again which were negative and at this point I have recommended that we go ahead and order an MRI to try to rule out any kind of acute injury which may have occurred. Patient is scheduled for MRI will be seen back with results  X-rays were negative for signs that there is any kind of a fracture dorsal

## 2016-12-23 DIAGNOSIS — G43009 Migraine without aura, not intractable, without status migrainosus: Secondary | ICD-10-CM | POA: Diagnosis not present

## 2016-12-23 DIAGNOSIS — F900 Attention-deficit hyperactivity disorder, predominantly inattentive type: Secondary | ICD-10-CM | POA: Diagnosis not present

## 2016-12-23 DIAGNOSIS — Z9189 Other specified personal risk factors, not elsewhere classified: Secondary | ICD-10-CM | POA: Diagnosis not present

## 2016-12-23 DIAGNOSIS — Z6829 Body mass index (BMI) 29.0-29.9, adult: Secondary | ICD-10-CM | POA: Diagnosis not present

## 2016-12-23 DIAGNOSIS — K623 Rectal prolapse: Secondary | ICD-10-CM | POA: Diagnosis not present

## 2016-12-23 DIAGNOSIS — K219 Gastro-esophageal reflux disease without esophagitis: Secondary | ICD-10-CM | POA: Diagnosis not present

## 2016-12-23 DIAGNOSIS — G35 Multiple sclerosis: Secondary | ICD-10-CM | POA: Diagnosis not present

## 2016-12-23 DIAGNOSIS — M797 Fibromyalgia: Secondary | ICD-10-CM | POA: Diagnosis not present

## 2016-12-23 DIAGNOSIS — E782 Mixed hyperlipidemia: Secondary | ICD-10-CM | POA: Diagnosis not present

## 2016-12-23 DIAGNOSIS — F331 Major depressive disorder, recurrent, moderate: Secondary | ICD-10-CM | POA: Diagnosis not present

## 2016-12-23 DIAGNOSIS — I7300 Raynaud's syndrome without gangrene: Secondary | ICD-10-CM | POA: Diagnosis not present

## 2016-12-26 ENCOUNTER — Telehealth: Payer: Self-pay | Admitting: *Deleted

## 2016-12-26 NOTE — Telephone Encounter (Signed)
Received 12/22/2016 clinicals and faxed to [employer] for pre-cert.

## 2016-12-31 ENCOUNTER — Ambulatory Visit
Admission: RE | Admit: 2016-12-31 | Discharge: 2016-12-31 | Disposition: A | Discharge status: Home / Self Care | Payer: PPO | Source: Ambulatory Visit | Attending: Podiatry | Admitting: Podiatry

## 2016-12-31 DIAGNOSIS — M19072 Primary osteoarthritis, left ankle and foot: Secondary | ICD-10-CM | POA: Diagnosis not present

## 2017-01-02 NOTE — Telephone Encounter (Signed)
Pt called for MRI results, states she is in pain. Left message requesting pt call for an appt to discuss the results.

## 2017-01-03 ENCOUNTER — Ambulatory Visit: Payer: PPO | Admitting: Sports Medicine

## 2017-01-03 ENCOUNTER — Telehealth: Payer: Self-pay | Admitting: Podiatry

## 2017-01-03 NOTE — Telephone Encounter (Signed)
I received a note from schedulers pt requesting results. Left message informing pt that the MRI results would be discussed at her appt today.

## 2017-01-03 NOTE — Telephone Encounter (Signed)
Patient called the "on call" number 01/02/2017 requesting her MRI results and that she was in a lot of pain, but this has been ongoing for some time. I gave her the results of the MRI and recommended immobilization in a CAM bot/sugical shoe for now but she states what she has makes the pain worse. I encouraged ice and elevation. Will get her in to see Dr. Paulla Dolly but he is not in the office until Wednesday. Will get her scheduled for tomorrow. She was thankful of the call back and verbalized understanding.    Celesta Gentile, DPM

## 2017-01-04 ENCOUNTER — Encounter: Payer: Self-pay | Admitting: Podiatry

## 2017-01-04 ENCOUNTER — Ambulatory Visit (INDEPENDENT_AMBULATORY_CARE_PROVIDER_SITE_OTHER): Payer: PPO | Admitting: Podiatry

## 2017-01-04 DIAGNOSIS — R600 Localized edema: Secondary | ICD-10-CM | POA: Diagnosis not present

## 2017-01-04 DIAGNOSIS — S92302A Fracture of unspecified metatarsal bone(s), left foot, initial encounter for closed fracture: Secondary | ICD-10-CM | POA: Diagnosis not present

## 2017-01-04 NOTE — Progress Notes (Signed)
Subjective:    Patient ID: Nancy Byrd, female   DOB: 51 y.o.   MRN: [id number]   HPI patient states my foot is really sore and it's been 4 weeks and I want to go over my MRI    ROS      Objective:  Physical Exam neurovascular status intact with patient's MRI showing fracture of the base of the second metatarsal left nondisplaced with inflammation noted and normal inflammation around the healing bunion and metatarsal site     Assessment:    Fracture second metatarsal secondary to injury she sustained at home with healing osteotomies noted     Plan:    H&P discussed that this should heal uneventfully but may take several more months and I applied Unna boot Ace wrap and gave her a new type surgical shoe to take pressure off the foot and reappoint one week to recheck or earlier if issues should occur but that should gradually get better over the next few months

## 2017-01-06 ENCOUNTER — Other Ambulatory Visit: Payer: PPO

## 2017-01-12 ENCOUNTER — Ambulatory Visit: Payer: PPO

## 2017-01-12 ENCOUNTER — Ambulatory Visit (INDEPENDENT_AMBULATORY_CARE_PROVIDER_SITE_OTHER): Payer: PPO | Admitting: Podiatry

## 2017-01-12 ENCOUNTER — Encounter: Payer: Self-pay | Admitting: Podiatry

## 2017-01-12 DIAGNOSIS — S92302A Fracture of unspecified metatarsal bone(s), left foot, initial encounter for closed fracture: Secondary | ICD-10-CM

## 2017-01-12 DIAGNOSIS — R600 Localized edema: Secondary | ICD-10-CM | POA: Diagnosis not present

## 2017-01-13 NOTE — Progress Notes (Signed)
Subjective:    Patient ID: Nancy Byrd, female   DOB: 51 y.o.   MRN: [id number]   HPI patient's doing quite a bit better and states the Unna boot is helping her quite a bit and the swelling and pain is slowly receiving    ROS      Objective:  Physical Exam neurovascular status intact with continued discomfort in the second metatarsal proximal shaft with improvement in pain with inflammation still noted     Assessment:   Proximal fracture second metatarsal left with inflammation and pain      Plan:    Reviewed and at this point I reapplied Unna boot with instructions on usage and continue with compression elevation immobilization and then reappoint again in 3 weeks or earlier if needed

## 2017-01-26 ENCOUNTER — Ambulatory Visit: Payer: PPO | Admitting: Orthotics

## 2017-01-26 ENCOUNTER — Encounter: Payer: Self-pay | Admitting: Podiatry

## 2017-01-26 ENCOUNTER — Ambulatory Visit (INDEPENDENT_AMBULATORY_CARE_PROVIDER_SITE_OTHER): Payer: PPO | Admitting: Podiatry

## 2017-01-26 ENCOUNTER — Ambulatory Visit: Payer: PPO

## 2017-01-26 DIAGNOSIS — S92302A Fracture of unspecified metatarsal bone(s), left foot, initial encounter for closed fracture: Secondary | ICD-10-CM

## 2017-01-26 DIAGNOSIS — R600 Localized edema: Secondary | ICD-10-CM

## 2017-01-26 DIAGNOSIS — B351 Tinea unguium: Secondary | ICD-10-CM | POA: Diagnosis not present

## 2017-01-26 DIAGNOSIS — M2042 Other hammer toe(s) (acquired), left foot: Secondary | ICD-10-CM

## 2017-01-26 DIAGNOSIS — S92302D Fracture of unspecified metatarsal bone(s), left foot, subsequent encounter for fracture with routine healing: Secondary | ICD-10-CM

## 2017-01-27 NOTE — Progress Notes (Signed)
Subjective:   Patient ID: Nancy Byrd, female   DOB: 51 y.o.   MRN: [id number]   HPI Patient presents stating I am still getting some pain on top of my left foot and the moist dressing is getting a little more difficult for me to wear.  States that it is not getting worse but still can give her some problems   ROS      Objective:  Physical Exam  Neurovascular status intact with inflammation dorsum left foot that is present with pain still present upon deep palpation but improved from previous visit     Assessment:  Inflammatory tendinitis left that is present with a fracture base of second metatarsal which appears to be healing as there is not significant edema but I did discuss if it continues to bother her the consideration for bone stimulator may be needed     Plan:  Reviewed condition continue using her surgical shoe and rigid bottom shoes and reappoint 6 weeks or earlier if needed and also did dispense an uncle topical for some fungal nail conditions

## 2017-02-09 ENCOUNTER — Other Ambulatory Visit: Payer: PPO | Admitting: Orthotics

## 2017-02-10 NOTE — Progress Notes (Signed)
Wants F/O redone to reflect changes in foot condition; will send to [facility] for corrections.

## 2017-02-16 ENCOUNTER — Encounter: Payer: Self-pay | Admitting: Podiatry

## 2017-02-16 ENCOUNTER — Ambulatory Visit (INDEPENDENT_AMBULATORY_CARE_PROVIDER_SITE_OTHER): Payer: PPO | Admitting: Podiatry

## 2017-02-16 ENCOUNTER — Ambulatory Visit: Payer: PPO

## 2017-02-16 DIAGNOSIS — H9202 Otalgia, left ear: Secondary | ICD-10-CM | POA: Diagnosis not present

## 2017-02-16 DIAGNOSIS — S92302D Fracture of unspecified metatarsal bone(s), left foot, subsequent encounter for fracture with routine healing: Secondary | ICD-10-CM

## 2017-02-16 DIAGNOSIS — Q828 Other specified congenital malformations of skin: Secondary | ICD-10-CM

## 2017-02-16 DIAGNOSIS — M722 Plantar fascial fibromatosis: Secondary | ICD-10-CM | POA: Diagnosis not present

## 2017-02-16 MED ORDER — TRIAMCINOLONE ACETONIDE 10 MG/ML IJ SUSP
10.0000 mg | Freq: Once | INTRAMUSCULAR | Status: AC
  Administered 2017-02-16: 10 mg

## 2017-02-16 NOTE — Patient Instructions (Signed)

## 2017-03-01 DIAGNOSIS — I83812 Varicose veins of left lower extremities with pain: Secondary | ICD-10-CM | POA: Diagnosis not present

## 2017-03-09 ENCOUNTER — Encounter: Payer: Self-pay | Admitting: Podiatry

## 2017-03-09 ENCOUNTER — Ambulatory Visit (INDEPENDENT_AMBULATORY_CARE_PROVIDER_SITE_OTHER): Payer: PPO | Admitting: Podiatry

## 2017-03-09 DIAGNOSIS — R600 Localized edema: Secondary | ICD-10-CM

## 2017-03-09 DIAGNOSIS — S92302A Fracture of unspecified metatarsal bone(s), left foot, initial encounter for closed fracture: Secondary | ICD-10-CM

## 2017-03-09 DIAGNOSIS — M79672 Pain in left foot: Secondary | ICD-10-CM | POA: Diagnosis not present

## 2017-03-09 NOTE — Progress Notes (Signed)
Subjective:   Patient ID: Nancy Byrd, female   DOB: 52 y.o.   MRN: [id number]   HPI Patient presents stating it seems to be a little bit better but I still have pain I can't wear certain shoes   ROS      Objective:  Physical Exam  Neurovascular status intact with patient's left forefoot around the second metatarsal base still sore with mild edema and good correction of the bunion with good structural alignment and fifth metatarsal     Assessment:  Gradual improvement as she is wearing shoes now with slow healing of the second metatarsal proximal shaft but no indication of long-term sequela     Plan:  Reviewed condition at great length and I do think that ultimately this will heal but it's been a take about 6-9 months. She is comfortable as she is able to wear shoes we spent a great of time going over ice therapy and shoe gear choices

## 2017-04-06 DIAGNOSIS — Z9189 Other specified personal risk factors, not elsewhere classified: Secondary | ICD-10-CM | POA: Diagnosis not present

## 2017-04-06 DIAGNOSIS — R42 Dizziness and giddiness: Secondary | ICD-10-CM | POA: Diagnosis not present

## 2017-04-06 DIAGNOSIS — E876 Hypokalemia: Secondary | ICD-10-CM | POA: Diagnosis not present

## 2017-04-06 DIAGNOSIS — R5382 Chronic fatigue, unspecified: Secondary | ICD-10-CM | POA: Diagnosis not present

## 2017-04-06 DIAGNOSIS — F331 Major depressive disorder, recurrent, moderate: Secondary | ICD-10-CM | POA: Diagnosis not present

## 2017-04-06 DIAGNOSIS — K210 Gastro-esophageal reflux disease with esophagitis: Secondary | ICD-10-CM | POA: Diagnosis not present

## 2017-04-06 DIAGNOSIS — F900 Attention-deficit hyperactivity disorder, predominantly inattentive type: Secondary | ICD-10-CM | POA: Diagnosis not present

## 2017-04-06 DIAGNOSIS — D519 Vitamin B12 deficiency anemia, unspecified: Secondary | ICD-10-CM | POA: Diagnosis not present

## 2017-04-06 DIAGNOSIS — E782 Mixed hyperlipidemia: Secondary | ICD-10-CM | POA: Diagnosis not present

## 2017-04-10 DIAGNOSIS — G35 Multiple sclerosis: Secondary | ICD-10-CM | POA: Diagnosis not present

## 2017-04-10 DIAGNOSIS — I7300 Raynaud's syndrome without gangrene: Secondary | ICD-10-CM | POA: Diagnosis not present

## 2017-04-10 DIAGNOSIS — Z9189 Other specified personal risk factors, not elsewhere classified: Secondary | ICD-10-CM | POA: Diagnosis not present

## 2017-04-10 DIAGNOSIS — K219 Gastro-esophageal reflux disease without esophagitis: Secondary | ICD-10-CM | POA: Diagnosis not present

## 2017-04-10 DIAGNOSIS — R300 Dysuria: Secondary | ICD-10-CM | POA: Diagnosis not present

## 2017-04-10 DIAGNOSIS — Z0001 Encounter for general adult medical examination with abnormal findings: Secondary | ICD-10-CM | POA: Diagnosis not present

## 2017-04-10 DIAGNOSIS — R0602 Shortness of breath: Secondary | ICD-10-CM | POA: Diagnosis not present

## 2017-04-10 DIAGNOSIS — R072 Precordial pain: Secondary | ICD-10-CM | POA: Diagnosis not present

## 2017-04-10 DIAGNOSIS — F331 Major depressive disorder, recurrent, moderate: Secondary | ICD-10-CM | POA: Diagnosis not present

## 2017-04-10 DIAGNOSIS — E782 Mixed hyperlipidemia: Secondary | ICD-10-CM | POA: Diagnosis not present

## 2017-04-10 DIAGNOSIS — K623 Rectal prolapse: Secondary | ICD-10-CM | POA: Diagnosis not present

## 2017-04-10 DIAGNOSIS — M797 Fibromyalgia: Secondary | ICD-10-CM | POA: Diagnosis not present

## 2017-04-10 DIAGNOSIS — G43009 Migraine without aura, not intractable, without status migrainosus: Secondary | ICD-10-CM | POA: Diagnosis not present

## 2017-04-10 DIAGNOSIS — F900 Attention-deficit hyperactivity disorder, predominantly inattentive type: Secondary | ICD-10-CM | POA: Diagnosis not present

## 2017-04-19 ENCOUNTER — Ambulatory Visit: Payer: PPO | Admitting: Neurology

## 2017-04-20 ENCOUNTER — Encounter: Payer: Self-pay | Admitting: Neurology

## 2017-04-24 DIAGNOSIS — I83812 Varicose veins of left lower extremities with pain: Secondary | ICD-10-CM | POA: Diagnosis not present

## 2017-04-24 DIAGNOSIS — I8312 Varicose veins of left lower extremity with inflammation: Secondary | ICD-10-CM | POA: Diagnosis not present

## 2017-04-27 ENCOUNTER — Ambulatory Visit: Payer: PPO | Admitting: Cardiology

## 2017-04-27 ENCOUNTER — Telehealth: Payer: Self-pay | Admitting: Cardiology

## 2017-04-27 ENCOUNTER — Other Ambulatory Visit: Payer: Self-pay

## 2017-04-27 ENCOUNTER — Encounter: Payer: Self-pay | Admitting: Cardiology

## 2017-04-27 VITALS — BP 115/76 | HR 62 | Ht 66.0 in | Wt 183.0 lb

## 2017-04-27 DIAGNOSIS — R0789 Other chest pain: Secondary | ICD-10-CM | POA: Diagnosis not present

## 2017-04-27 DIAGNOSIS — R0602 Shortness of breath: Secondary | ICD-10-CM | POA: Diagnosis not present

## 2017-04-27 NOTE — Telephone Encounter (Signed)
Pre-cert Verification for the following procedure   Echo scheduled for 05-17-17

## 2017-04-27 NOTE — Progress Notes (Signed)
Clinical Summary Nancy Byrd is a 52 y.o.female seen as new consult, referred by Dr Quillian Quince for chest pain.  1. Chest pain/SOB - 05/2006 cath normal coronaries - midchest sharp pain, 10/10 in severity. Worst with moving. + SOB. Worst with movement. Diffuse swelling. - pain lasted Saturday and Sunday.  - she took lasix without benefit, toresemide improved symptoms.  Past Medical History:  Diagnosis Date  . Carpal tunnel syndrome    right hand  . Collagen vascular disease ([place])   . DDD (degenerative disc disease), cervical   . Depression   . GERD (gastroesophageal reflux disease)   . Heart murmur   . Hypercholesterolemia   . Lumbar disc herniation    L1-L2; L4-L5  . Migraines   . Multiple sclerosis ([place])   . Neuropathy   . Optic neuritis   . Pernicious anemia   . Rheumatoid arthritis ([place])    right hand  . Spinal stenosis   . Tenosynovitis      Allergies  Allergen Reactions  . Enbrel [Etanercept] Anaphylaxis  . Interferon Beta-1a Anaphylaxis  . Other Anaphylaxis    rebiff Bee stings  . Betadine [Povidone Iodine]   . Shellfish Allergy   . Tdap [Diphth-Acell Pertussis-Tetanus]     Vomiting, nausea, dizzy  . Amoxicillin Rash  . Iohexol Rash     Desc: ANAPHYLACTIC REACTION TO IODINE  Desc: ANAPHYLACTIC REACTION TO IODINE      Current Outpatient Medications  Medication Sig Dispense Refill  . acetaminophen (TYLENOL) 500 MG tablet Take 1,000 mg by mouth every 8 (eight) hours as needed.    Marland Kitchen aluminum hydroxide-magnesium carbonate (GAVISCON) 95-358 MG/15ML SUSP Take by mouth as needed.    Marland Kitchen amphetamine-dextroamphetamine (ADDERALL XR) 30 MG 24 hr capsule Take 30 mg by mouth daily.     . Ascorbic Acid (VITAMIN C) 1000 MG tablet Take 1,000 mg by mouth daily.    . Calcium Carbonate-Vitamin D (CALCIUM + D PO) Take 1,200 mg by mouth daily.    . cetirizine (ZYRTEC) 10 MG tablet Take 10 mg by mouth daily.    . Cyanocobalamin (VITAMIN B-12 IJ) Inject as directed every 30  (thirty) days.    Marland Kitchen doxycycline (VIBRA-TABS) 100 MG tablet Take 1 tablet (100 mg total) by mouth 2 (two) times daily. 20 tablet 0  . DULoxetine (CYMBALTA) 60 MG capsule Take 2 capsules (120 mg total) by mouth daily. 60 capsule 11  . EPINEPHrine 0.3 mg/0.3 mL IJ SOAJ injection Inject 0.3 mg into the muscle once.    . Flax OIL Take 1,000 mg by mouth daily. Reported on 04/16/2015    . furosemide (LASIX) 20 MG tablet Take 20 mg by mouth as needed.    . gabapentin (NEURONTIN) 600 MG tablet Take 600 mg by mouth 3 (three) times daily as needed. Reported on 04/16/2015    . lactase (LACTAID) 3000 UNITS tablet Take by mouth 3 (three) times daily with meals.    . meclizine (ANTIVERT) 25 MG tablet Take 25 mg by mouth 3 (three) times daily as needed for dizziness.    . Modafinil (PROVIGIL PO) Take 600 mg by mouth. Per patient take 400 mg in the morning and 200 mg at noon po    . MYRBETRIQ 50 MG TB24 tablet TAKE ONE (1) TABLET BY MOUTH EVERY DAY 90 tablet 3  . ondansetron (ZOFRAN) 4 MG tablet Take 1 mg by mouth every 8 (eight) hours as needed for nausea or vomiting.    . potassium  chloride SA (K-DUR,KLOR-CON) 20 MEQ tablet     . Probiotic Product (ALIGN PO) Take by mouth.    . propranolol ER (INDERAL LA) 80 MG 24 hr capsule Take 1 capsule (80 mg total) by mouth daily. 30 capsule 11  . RABEprazole (ACIPHEX) 20 MG tablet Take 1 tablet (20 mg total) by mouth daily. 30 tablet 11  . rOPINIRole (REQUIP) 0.25 MG tablet Take 1 tablet (0.25 mg total) by mouth as needed. 90 tablet 5  . SOYA LECITHIN PO Take 1,200 mg by mouth daily.    Marland Kitchen topiramate (TOPAMAX) 200 MG tablet Take 200 mg by mouth 2 (two) times daily.    Marland Kitchen UBIQUINOL PO Take by mouth.    . zolmitriptan (ZOMIG) 5 MG tablet Take 5 mg by mouth as needed for migraine.     No current facility-administered medications for this visit.      Past Surgical History:  Procedure Laterality Date  . APPENDECTOMY  06/07/04  . CARDIAC CATHETERIZATION  06/13/06  .  CHOLECYSTECTOMY  1989  . NASAL FRACTURE SURGERY     as a child  . TENOSYNOVECTOMY Bilateral 1985  . TONSILECTOMY, ADENOIDECTOMY, BILATERAL MYRINGOTOMY AND TUBES     as a child  . TOTAL ABDOMINAL HYSTERECTOMY  06/07/04     Allergies  Allergen Reactions  . Enbrel [Etanercept] Anaphylaxis  . Interferon Beta-1a Anaphylaxis  . Other Anaphylaxis    rebiff Bee stings  . Betadine [Povidone Iodine]   . Shellfish Allergy   . Tdap [Diphth-Acell Pertussis-Tetanus]     Vomiting, nausea, dizzy  . Amoxicillin Rash  . Iohexol Rash     Desc: ANAPHYLACTIC REACTION TO IODINE  Desc: ANAPHYLACTIC REACTION TO IODINE       Family History  Problem Relation Age of Onset  . Colon cancer Neg Hx   . Colon polyps Neg Hx      Social History Ms. Thackston reports that  has never smoked. she has never used smokeless tobacco. Ms. Shartzer reports that she does not drink alcohol.   Review of Systems CONSTITUTIONAL: No weight loss, fever, chills, weakness or fatigue.  HEENT: Eyes: No visual loss, blurred vision, double vision or yellow sclerae.No hearing loss, sneezing, congestion, runny nose or sore throat.  SKIN: No rash or itching.  CARDIOVASCULAR: per hpi RESPIRATORY:per hpi GASTROINTESTINAL: No anorexia, nausea, vomiting or diarrhea. No abdominal pain or blood.  GENITOURINARY: No burning on urination, no polyuria NEUROLOGICAL: No headache, dizziness, syncope, paralysis, ataxia, numbness or tingling in the extremities. No change in bowel or bladder control.  MUSCULOSKELETAL: No muscle, back pain, joint pain or stiffness.  LYMPHATICS: No enlarged nodes. No history of splenectomy.  PSYCHIATRIC: No history of depression or anxiety.  ENDOCRINOLOGIC: No reports of sweating, cold or heat intolerance. No polyuria or polydipsia.  Marland Kitchen   Physical Examination Vitals:   04/27/17 1455  BP: 115/76  Pulse: 62  SpO2: 98%   Vitals:   04/27/17 1455  Weight: 183 lb (83 kg)  Height: 5\' 6"  (1.676 m)    Gen:  resting comfortably, no acute distress HEENT: no scleral icterus, pupils equal round and reactive, no palptable cervical adenopathy,  CV: RRR, no m/r/g, no jvd Resp: Clear to auscultation bilaterally GI: abdomen is soft, non-tender, non-distended, normal bowel sounds, no hepatosplenomegaly MSK: extremities are warm, no edema.  Skin: warm, no rash Neuro:  no focal deficits Psych: appropriate affect   Diagnostic Studies 05/2006 CARDIAC CATHETERIZATION   PROCEDURES:  1. Left heart catheterization.  2. Coronary angiography.  3. Left ventriculogram.  4. Abdominal arteriogram.   ATTENDING PHYSICIAN:  Octavia Heir, MD   COMPLICATIONS:  None.   INDICATIONS:  Ms. Lavergne is a 52 year old female patient of Dr. Gar Ponto with a history of multiple sclerosis, pernicious anemia, reflux  disease, rheumatoid arthritis who recently complained of a rest and  exertional chest pain with sensation of crushing type of pressure and  some facial edema.  She was ultimately evaluated in the emergency room  and she was felt to have a normal EKG.  She is now referred for cardiac  catheterization without significant CAD.   After obtaining informed consent, patient was brought to the cardiac  cath lab.  Shaved, prepped and draped in the usual sterile fashion.  __________  modified Seldinger technique, #6 French arterial sheath in  the right femoral artery.  A 6 French diagnostic catheter was performed  and diagnostic angiography.   Left main is a large vessel showed no evidence of disease.   The LAD to large vessel __________  2 diagonal branches to the LAD has  no significant disease.   The first diagonal to the small vessel has no significant disease.   The second diagonal to the small vessel has significant disease.   Left circumflex to the large vessel which was dominant, gives rise to  two obtuse marginal branches as well as PD and post lateral Emelly Wurtz.  AVB  circumflex has  no significant disease.   The first OM is a large vessel which bifurcates in the mid segment with  no significant disease.   The second OM is a medium size vessel with no significant disease.   The PDA and  posterolateral branches are large vessels which originate  from the circumflex has no significant disease.   The right coronary artery is a small, nondominant vessel with  significant disease.   Left ventriculogram reveals an EF of 60%.   Abdominal aortogram has no evidence of coronary artery stenosis.   Hemodynamic system:  Arterial pressure of 829/93, LV systolic pressure  716/96. LVDEP Of 13.   CONCLUSION:  1. No significant coronary artery disease.  2. No evidence of systolic dysfunction.  3. No evidence renal artery stenosis.  4. Systemic hypertension.    Assessment and Plan  1. Chest pain/SOB - chest pain symptoms not overally typical for cardiac etiology - with her recent SOB and swelling with obtain echo to evaluate for possible cardiac dysfunction   F/u pending test results   Arnoldo Lenis, M.D.

## 2017-04-27 NOTE — Patient Instructions (Signed)
Your physician recommends that you schedule a follow-up appointment in: TO BE DETERMINED AFTER TESTING   Your physician recommends that you continue on your current medications as directed. Please refer to the Current Medication list given to you today.  Your physician has requested that you have an echocardiogram. Echocardiography is a painless test that uses sound waves to create images of your heart. It provides your doctor with information about the size and shape of your heart and how well your heart's chambers and valves are working. This procedure takes approximately one hour. There are no restrictions for this procedure.  Thank you for choosing [facility]!!    

## 2017-04-28 DIAGNOSIS — M79676 Pain in unspecified toe(s): Secondary | ICD-10-CM

## 2017-05-02 ENCOUNTER — Encounter: Payer: Self-pay | Admitting: Cardiology

## 2017-05-04 ENCOUNTER — Encounter: Payer: Self-pay | Admitting: *Deleted

## 2017-05-17 ENCOUNTER — Ambulatory Visit (INDEPENDENT_AMBULATORY_CARE_PROVIDER_SITE_OTHER): Payer: PPO

## 2017-05-17 ENCOUNTER — Other Ambulatory Visit: Payer: Self-pay

## 2017-05-17 DIAGNOSIS — R0602 Shortness of breath: Secondary | ICD-10-CM

## 2017-05-24 ENCOUNTER — Telehealth: Payer: Self-pay | Admitting: *Deleted

## 2017-05-24 NOTE — Telephone Encounter (Signed)
Pt aware and voiced understanding - says she will f/u with pcp and call us prn. Will forward to provider FYI

## 2017-05-24 NOTE — Telephone Encounter (Signed)
-----   Message from Arnoldo Lenis, MD sent at 05/22/2017 12:30 PM EDT ----- Echo looks very good, normal heart function. Nothing to explain her recent symptoms. She should f/u in 4 weeks to discuss results in detail and reassess her symptoms.   Zandra Abts MD

## 2017-05-25 ENCOUNTER — Encounter: Payer: Self-pay | Admitting: Neurology

## 2017-05-25 ENCOUNTER — Ambulatory Visit (INDEPENDENT_AMBULATORY_CARE_PROVIDER_SITE_OTHER): Payer: PPO | Admitting: Neurology

## 2017-05-25 ENCOUNTER — Other Ambulatory Visit: Payer: Self-pay

## 2017-05-25 VITALS — BP 122/72 | HR 62 | Resp 18 | Ht 66.0 in | Wt 187.5 lb

## 2017-05-25 DIAGNOSIS — G250 Essential tremor: Secondary | ICD-10-CM

## 2017-05-25 DIAGNOSIS — R5383 Other fatigue: Secondary | ICD-10-CM | POA: Diagnosis not present

## 2017-05-25 DIAGNOSIS — N39498 Other specified urinary incontinence: Secondary | ICD-10-CM | POA: Diagnosis not present

## 2017-05-25 DIAGNOSIS — F418 Other specified anxiety disorders: Secondary | ICD-10-CM | POA: Diagnosis not present

## 2017-05-25 DIAGNOSIS — G35 Multiple sclerosis: Secondary | ICD-10-CM

## 2017-05-25 DIAGNOSIS — R269 Unspecified abnormalities of gait and mobility: Secondary | ICD-10-CM

## 2017-05-25 MED ORDER — ARMODAFINIL 250 MG PO TABS: 250.0000 mg | ORAL_TABLET | Freq: Every day | ORAL | 5 refills | Status: DC

## 2017-05-25 NOTE — Progress Notes (Signed)
[facility]  PATIENT: Nancy Byrd DOB: [birth year]  REFERRING DOCTOR OR PCP:  Gar Ponto  Fax: [phone] SOURCE: patient, and records and MRI images on PACS  _________________________________   HISTORICAL  CHIEF COMPLAINT:  Chief Complaint  Patient presents with  . Multiple Sclerosis    Remains off of a dmt. Sts. fatigue is worse--doesn't think Adderall XR helps. Feels she needs a boost mid-afternoon; would like to discuss adding an IR Adderall in the afternoon.  Also sts. tremors are worse.  Sts. she is having difficulty with CHF, has seen a cardiologist./fim    HISTORY OF PRESENT ILLNESS:  Nancy Byrd is a 52 y.o. woman with MS.      Update 05/25/2017: She remains off of a DMT and the last MRI was stable.  Fatigue is worse.    Adderall helps her.   She was once on 140 mg a day and I discussed that that dose is too high.  She is currently only on Adderall XR 30 mg and feels it only helps mildly in the mornings.      She reports tingling "all over" in the face, hands and feet.  It is intermittent and is bilateral.    This just recently started.   She is on Topamax but the dose has been unchanged x years.    She could not tolerate gabapentin.  She is on Cymbalta.    Her urinary urgency and incontinence is improved with Myrbetriq.  She reports that the tremor is doing about the same.  She thinks that the beta-blocker has helped her some.  She has edema in her hands and feet and was told she might have CHF.  However an ECHO is reportedly normal.      From 10/17/2016: MS:  She remains off any DMT and feels mostly stable.   The MRI of the brain 03/2015 was unchanged compared to 2008.    She did feel worse when she had an E. Coli UTI.    MRI cervical spine showed multilevel DJD with moderate spinal stenosis at C5C6 and mild spinal stenosis at C4C5 but no demyelinating lesions or myelopathic signal.        Gait/strength/sensation:  She continues to have  difficulty with her gait. Balance is off and her right leg drags when she is tired. She falls every month or 2.   She can walk without a cane but used to use one (or walker at times).  She has mild weakness and spasticity in her legs, right > left.   She notes no numbness. She has had a tremor in her arms the past year.     Tremor:   She has a head tremor more than the head.   The head tremor 'drives her nuts'.   Propranolol 60 mg daily has helped the tremor some.  She never filled the clonazepam.     She has a strong FH.  Nadolol helped better than propranolol but caused low BP.     Vision:  She has no major difficulties with her vision. There is no diplopia.  Bladder:   She has bladder frequency and incontinence helped most by Myrbetriq.    Fatigue/sleep: She still has a lot of fatigue.  She has some benefit from Adderall XR 30 mg twice a day.    She has trouble falling asleep and has 3 or 4 bad nights ina row and then sleeps > 10 hours the next night.  She notes restless leg syndrome and takes ropinirole with mild benefit only.  Mood/cognition:   She has more depression, despite duloxetine 120 mg daily and has had irritable and has crying spells.   She also has some anxiety. She also has had difficulties with cognition. She notes poor focus, reduced executive function, verbal fluency and short-term memory.    Other:  She had foot surgery in April(left) and had compkications requiring a second operation.    She had fusion surgery by Dr. Joya Salm September 2017 for multilevel spinal stenosis at C3-C6.     MS History:    She was diagnosed in 2004 after presenting with dizziness. At that time, she was being treated with Enbrel for her rheumatoid arthritis. She had an MRI and lumbar puncture confirming the diagnosis. She also had several episodes of optic neuritis. She was treated with steroids for her initial symptoms of the optic neuritis. She was placed on Rebif. She was on Rebif for about a year  but then stopped due to severe side effects. She reports having an anaphylactic reaction and be hospitalized and having a cardiac workup. She stopped the Rebif and has not gone any disease modifying therapy since that time, around 2007.   She has seen several neurologists including Dr. Jannifer Franklin, Dr. Jacqulynn Cadet and Dr. Otelia Sergeant [facility]).    She reports that Tysabri was discussed with her but due to potential of PML she opted not to go on the medication. Her last MRI of the brain is from 2009. I personally reviewed the images. She has several T2/FLAIR hyperintense foci including some in the periventricular white matter. The MRIs of the cervical and thoracic spine from 2011 show some degenerative changes but did not show any MS changes.  Repeat brain MRI 03/20/2015 did not show any significant changes. Repeat cervical spine MRI 03/20/2015 to not show any spinal cord lesions. She has mild spinal stenosis at C4-C5 with possible impingement of the left C5 nerve root. She has moderate spinal stenosis at C5-C6 with possible right C6 nerve root impingement. The C5C6 level have progressed appeared to her previous MRI.   Her dad had MS.    REVIEW OF SYSTEMS: Constitutional: No fevers, chills, sweats, or change in appetite.   She has fatigue and poor sleep. She has restless leg syndrome. Eyes: No visual changes, double vision, eye pain Ear, nose and throat: No hearing loss, ear pain, nasal congestion, sore throat Cardiovascular: No chest pain, palpitations Respiratory: No shortness of breath at rest or with exertion.   No wheezes GastrointestinaI: No nausea, vomiting, diarrhea, abdominal pain, fecal incontinence Genitourinary: as above. Musculoskeletal: Reports some muscle and joint pain Integumentary: No rash, pruritus, skin lesions Neurological: as above Psychiatric: Notes depression and anxiety Endocrine: No palpitations, diaphoresis, change in appetite, change in weigh or increased  thirst Hematologic/Lymphatic: No anemia, purpura, petechiae. Allergic/Immunologic: No itchy/runny eyes, nasal congestion, recent allergic reactions, rashes  ALLERGIES: Allergies  Allergen Reactions  . Enbrel [Etanercept] Anaphylaxis  . Interferon Beta-1a Anaphylaxis  . Other Anaphylaxis    rebiff Bee stings  . Betadine [Povidone Iodine]   . Shellfish Allergy   . Tdap [Tetanus-Diphth-Acell Pertussis]     Vomiting, nausea, dizzy  . Amoxicillin Rash  . Iohexol Rash     Desc: ANAPHYLACTIC REACTION TO IODINE  Desc: ANAPHYLACTIC REACTION TO IODINE     HOME MEDICATIONS:  Current Outpatient Medications:  .  acetaminophen (TYLENOL) 500 MG tablet, Take 1,000 mg by mouth every 8 (eight) hours as needed., Disp: ,  Rfl:  .  ALPRAZolam (XANAX) 0.5 MG tablet, Take 0.5 mg by mouth at bedtime as needed for anxiety., Disp: , Rfl:  .  amphetamine-dextroamphetamine (ADDERALL XR) 30 MG 24 hr capsule, Take 30 mg by mouth daily. , Disp: , Rfl:  .  Ascorbic Acid (VITAMIN C) 1000 MG tablet, Take 1,000 mg by mouth daily., Disp: , Rfl:  .  aspirin EC 81 MG tablet, Take 81 mg by mouth daily., Disp: , Rfl:  .  busPIRone (BUSPAR) 10 MG tablet, , Disp: , Rfl:  .  Calcium Carbonate-Vitamin D (CALCIUM + D PO), Take 1,200 mg by mouth daily., Disp: , Rfl:  .  cetirizine (ZYRTEC) 10 MG tablet, Take 10 mg by mouth daily., Disp: , Rfl:  .  Cyanocobalamin (VITAMIN B-12 IJ), Inject as directed every 30 (thirty) days., Disp: , Rfl:  .  DULoxetine (CYMBALTA) 60 MG capsule, Take 2 capsules (120 mg total) by mouth daily., Disp: 60 capsule, Rfl: 11 .  EPINEPHrine 0.3 mg/0.3 mL IJ SOAJ injection, Inject 0.3 mg into the muscle once., Disp: , Rfl:  .  lactase (LACTAID) 3000 UNITS tablet, Take by mouth 3 (three) times daily with meals., Disp: , Rfl:  .  meclizine (ANTIVERT) 25 MG tablet, Take 25 mg by mouth 3 (three) times daily as needed for dizziness., Disp: , Rfl:  .  MYRBETRIQ 50 MG TB24 tablet, TAKE ONE (1) TABLET BY  MOUTH EVERY DAY, Disp: 90 tablet, Rfl: 3 .  omeprazole (PRILOSEC) 20 MG capsule, Take 20 mg by mouth daily., Disp: , Rfl:  .  ondansetron (ZOFRAN) 4 MG tablet, Take 1 mg by mouth every 8 (eight) hours as needed for nausea or vomiting., Disp: , Rfl:  .  perphenazine-amitriptyline (ETRAFON/TRIAVIL) 4-25 MG TABS tablet, Take 1 tablet by mouth 2 (two) times daily., Disp: , Rfl:  .  Probiotic Product (ALIGN PO), Take by mouth., Disp: , Rfl:  .  propranolol ER (INDERAL LA) 80 MG 24 hr capsule, Take 1 capsule (80 mg total) by mouth daily., Disp: 30 capsule, Rfl: 11 .  RABEprazole (ACIPHEX) 20 MG tablet, Take 1 tablet (20 mg total) by mouth daily., Disp: 30 tablet, Rfl: 11 .  rOPINIRole (REQUIP) 0.25 MG tablet, Take 1 tablet (0.25 mg total) by mouth as needed., Disp: 90 tablet, Rfl: 5 .  scopolamine (TRANSDERM-SCOP) 1 MG/3DAYS, Place 1 patch onto the skin every 3 (three) days., Disp: , Rfl:  .  SOYA LECITHIN PO, Take 1,200 mg by mouth daily., Disp: , Rfl:  .  thiamine (VITAMIN B-1) 50 MG tablet, Take 50 mg by mouth daily., Disp: , Rfl:  .  topiramate (TOPAMAX) 200 MG tablet, Take 200 mg by mouth 2 (two) times daily., Disp: , Rfl:  .  torsemide (DEMADEX) 20 MG tablet, Take 20 mg by mouth 2 (two) times daily., Disp: , Rfl:  .  UBIQUINOL PO, Take by mouth., Disp: , Rfl:  .  zolmitriptan (ZOMIG) 5 MG tablet, Take 5 mg by mouth as needed for migraine., Disp: , Rfl:  .  Armodafinil 250 MG tablet, Take 1 tablet (250 mg total) by mouth daily., Disp: 30 tablet, Rfl: 5  PAST MEDICAL HISTORY: Past Medical History:  Diagnosis Date  . Carpal tunnel syndrome    right hand  . Collagen vascular disease ([place])   . DDD (degenerative disc disease), cervical   . Depression   . GERD (gastroesophageal reflux disease)   . Heart murmur   . Hypercholesterolemia   . Lumbar disc  herniation    L1-L2; L4-L5  . Migraines   . Multiple sclerosis ([place])   . Neuropathy   . Optic neuritis   . Pernicious anemia   . Rheumatoid  arthritis ([place])    right hand  . Spinal stenosis   . Tenosynovitis     PAST SURGICAL HISTORY: Past Surgical History:  Procedure Laterality Date  . APPENDECTOMY  06/07/04  . CARDIAC CATHETERIZATION  06/13/06  . CHOLECYSTECTOMY  1989  . NASAL FRACTURE SURGERY     as a child  . TENOSYNOVECTOMY Bilateral 1985  . TONSILECTOMY, ADENOIDECTOMY, BILATERAL MYRINGOTOMY AND TUBES     as a child  . TOTAL ABDOMINAL HYSTERECTOMY  06/07/04    FAMILY HISTORY: Family History  Problem Relation Age of Onset  . Colon cancer Neg Hx   . Colon polyps Neg Hx     SOCIAL HISTORY:  Social History   Socioeconomic History  . Marital status: Widowed    Spouse name: Not on file  . Number of children: Not on file  . Years of education: Not on file  . Highest education level: Not on file  Occupational History  . Not on file  Social Needs  . Financial resource strain: Not on file  . Food insecurity:    Worry: Not on file    Inability: Not on file  . Transportation needs:    Medical: Not on file    Non-medical: Not on file  Tobacco Use  . Smoking status: Never Smoker  . Smokeless tobacco: Never Used  Substance and Sexual Activity  . Alcohol use: No    Alcohol/week: 0.0 oz  . Drug use: No  . Sexual activity: Not on file  Lifestyle  . Physical activity:    Days per week: Not on file    Minutes per session: Not on file  . Stress: Not on file  Relationships  . Social connections:    Talks on phone: Not on file    Gets together: Not on file    Attends religious service: Not on file    Active member of club or organization: Not on file    Attends meetings of clubs or organizations: Not on file    Relationship status: Not on file  . Intimate partner violence:    Fear of current or ex partner: Not on file    Emotionally abused: Not on file    Physically abused: Not on file    Forced sexual activity: Not on file  Other Topics Concern  . Not on file  Social History Narrative  . Not on file      PHYSICAL EXAM  Vitals:   05/25/17 1143  BP: 122/72  Pulse: 62  Resp: 18  Weight: 187 lb 8 oz (85 kg)  Height: 5\' 6"  (1.676 m)    Body mass index is 30.26 kg/m.   General: The patient is well-developed and well-nourished and in no acute distress   Musculoskeletal:   She has Nodules on hand and toe joints.  Neurologic Exam  Mental status: The patient is alert and oriented x 3 at the time of the examination. The patient has apparent normal recent and remote memory, with an apparently normal attention span and concentration ability.   Speech is normal.  Cranial nerves: Extraocular movements are full.  Facial strength and sensation is normal.  Trapezius strength is strong.  The tongue is midline, and the patient has symmetric elevation of the soft palate. No obvious hearing deficits are  noted.  Motor: She has a 6-7 Hz low amplitude tremor in the hands and head.. Tremor increases when she tries to stay still.   Muscle bulk is normal.   Tone is slightly increased in legs, worse on the left. Strength is  5 / 5 in all 4 extremities.   Sensory: Normal sensation to touch and vibration in the arms and legs.    Coordination: Cerebellar testing r shows good finger-nose-finger and mildly reduced heel to shin, worse on the left.  Gait and station: Station is normal.   She has a left foot drop.. Tandem gait is wide. Romberg is negative.   Reflexes: Deep tendon reflexes are increased in legs, left > right.       DIAGNOSTIC DATA (LABS, IMAGING, TESTING) - I reviewed patient records, labs, notes, testing and imaging myself where available.  Lab Results  Component Value Date   WBC 5.3 02/18/2015   HGB 11.5 02/18/2015   HCT 36.1 02/18/2015   MCV 91 02/18/2015   PLT 291 01/06/2015       ASSESSMENT AND PLAN  Multiple sclerosis (HCC)  Essential tremor  Gait disturbance  Other urinary incontinence  Depression with anxiety  Other fatigue   1.   Her last MRI was stable  and she prefers to remain off of a disease modifying therapy. 2.   Add Nuvigil for her chronic fatigue related to her MS and sleep issues.  She will continue on Adderall written with her primary care.   3.   Continue Cymbalta 120 mg daily..  Try to exercise and stay active.4.    5.   Continue Myrbetriq for the bladder.    6.   Return in 6 months or sooner if there are new or worsening neurologic symptoms.  40-minute face-to-face evaluation with greater than one half of the time counseling and coordinating care about her MS symptoms, fatigue tremors and mood issues.  Leda Bellefeuille A. Felecia Shelling, MD, PhD 1/79/1505, 6:97 PM Certified in Neurology, Clinical Neurophysiology, Sleep Medicine, Pain Medicine and Neuroimaging  [facility] [address], [place], [place] [address] [phone]

## 2017-06-02 DIAGNOSIS — G43009 Migraine without aura, not intractable, without status migrainosus: Secondary | ICD-10-CM | POA: Diagnosis not present

## 2017-06-02 DIAGNOSIS — F900 Attention-deficit hyperactivity disorder, predominantly inattentive type: Secondary | ICD-10-CM | POA: Diagnosis not present

## 2017-06-02 DIAGNOSIS — R0602 Shortness of breath: Secondary | ICD-10-CM | POA: Diagnosis not present

## 2017-06-02 DIAGNOSIS — K623 Rectal prolapse: Secondary | ICD-10-CM | POA: Diagnosis not present

## 2017-06-02 DIAGNOSIS — I7300 Raynaud's syndrome without gangrene: Secondary | ICD-10-CM | POA: Diagnosis not present

## 2017-06-02 DIAGNOSIS — E782 Mixed hyperlipidemia: Secondary | ICD-10-CM | POA: Diagnosis not present

## 2017-06-02 DIAGNOSIS — G35 Multiple sclerosis: Secondary | ICD-10-CM | POA: Diagnosis not present

## 2017-06-02 DIAGNOSIS — Z6831 Body mass index (BMI) 31.0-31.9, adult: Secondary | ICD-10-CM | POA: Diagnosis not present

## 2017-06-13 DIAGNOSIS — G35 Multiple sclerosis: Secondary | ICD-10-CM | POA: Diagnosis not present

## 2017-06-13 DIAGNOSIS — K219 Gastro-esophageal reflux disease without esophagitis: Secondary | ICD-10-CM | POA: Diagnosis not present

## 2017-06-13 DIAGNOSIS — Z6831 Body mass index (BMI) 31.0-31.9, adult: Secondary | ICD-10-CM | POA: Diagnosis not present

## 2017-06-13 DIAGNOSIS — E785 Hyperlipidemia, unspecified: Secondary | ICD-10-CM | POA: Diagnosis not present

## 2017-06-13 DIAGNOSIS — L309 Dermatitis, unspecified: Secondary | ICD-10-CM | POA: Diagnosis not present

## 2017-06-13 DIAGNOSIS — M797 Fibromyalgia: Secondary | ICD-10-CM | POA: Diagnosis not present

## 2017-06-14 DIAGNOSIS — M797 Fibromyalgia: Secondary | ICD-10-CM | POA: Diagnosis not present

## 2017-06-14 DIAGNOSIS — G35 Multiple sclerosis: Secondary | ICD-10-CM | POA: Diagnosis not present

## 2017-06-14 DIAGNOSIS — K219 Gastro-esophageal reflux disease without esophagitis: Secondary | ICD-10-CM | POA: Diagnosis not present

## 2017-06-14 DIAGNOSIS — E785 Hyperlipidemia, unspecified: Secondary | ICD-10-CM | POA: Diagnosis not present

## 2017-07-03 DIAGNOSIS — M797 Fibromyalgia: Secondary | ICD-10-CM | POA: Diagnosis not present

## 2017-07-03 DIAGNOSIS — D519 Vitamin B12 deficiency anemia, unspecified: Secondary | ICD-10-CM | POA: Diagnosis not present

## 2017-07-03 DIAGNOSIS — K210 Gastro-esophageal reflux disease with esophagitis: Secondary | ICD-10-CM | POA: Diagnosis not present

## 2017-07-03 DIAGNOSIS — R5383 Other fatigue: Secondary | ICD-10-CM | POA: Diagnosis not present

## 2017-07-03 DIAGNOSIS — R42 Dizziness and giddiness: Secondary | ICD-10-CM | POA: Diagnosis not present

## 2017-07-03 DIAGNOSIS — Z9189 Other specified personal risk factors, not elsewhere classified: Secondary | ICD-10-CM | POA: Diagnosis not present

## 2017-07-03 DIAGNOSIS — R5382 Chronic fatigue, unspecified: Secondary | ICD-10-CM | POA: Diagnosis not present

## 2017-07-03 DIAGNOSIS — E782 Mixed hyperlipidemia: Secondary | ICD-10-CM | POA: Diagnosis not present

## 2017-07-03 DIAGNOSIS — E876 Hypokalemia: Secondary | ICD-10-CM | POA: Diagnosis not present

## 2017-07-05 DIAGNOSIS — F900 Attention-deficit hyperactivity disorder, predominantly inattentive type: Secondary | ICD-10-CM | POA: Diagnosis not present

## 2017-07-05 DIAGNOSIS — I7300 Raynaud's syndrome without gangrene: Secondary | ICD-10-CM | POA: Diagnosis not present

## 2017-07-05 DIAGNOSIS — G35 Multiple sclerosis: Secondary | ICD-10-CM | POA: Diagnosis not present

## 2017-07-05 DIAGNOSIS — K623 Rectal prolapse: Secondary | ICD-10-CM | POA: Diagnosis not present

## 2017-07-05 DIAGNOSIS — E782 Mixed hyperlipidemia: Secondary | ICD-10-CM | POA: Diagnosis not present

## 2017-07-05 DIAGNOSIS — N183 Chronic kidney disease, stage 3 (moderate): Secondary | ICD-10-CM | POA: Diagnosis not present

## 2017-07-05 DIAGNOSIS — Z6831 Body mass index (BMI) 31.0-31.9, adult: Secondary | ICD-10-CM | POA: Diagnosis not present

## 2017-07-05 DIAGNOSIS — R0602 Shortness of breath: Secondary | ICD-10-CM | POA: Diagnosis not present

## 2017-07-27 DIAGNOSIS — G35 Multiple sclerosis: Secondary | ICD-10-CM | POA: Diagnosis not present

## 2017-07-27 DIAGNOSIS — M797 Fibromyalgia: Secondary | ICD-10-CM | POA: Diagnosis not present

## 2017-07-27 DIAGNOSIS — K219 Gastro-esophageal reflux disease without esophagitis: Secondary | ICD-10-CM | POA: Diagnosis not present

## 2017-08-09 DIAGNOSIS — M654 Radial styloid tenosynovitis [de Quervain]: Secondary | ICD-10-CM | POA: Diagnosis not present

## 2017-08-17 ENCOUNTER — Encounter: Payer: Self-pay | Admitting: Neurology

## 2017-08-25 DIAGNOSIS — G35 Multiple sclerosis: Secondary | ICD-10-CM | POA: Diagnosis not present

## 2017-08-25 DIAGNOSIS — K219 Gastro-esophageal reflux disease without esophagitis: Secondary | ICD-10-CM | POA: Diagnosis not present

## 2017-08-25 DIAGNOSIS — M797 Fibromyalgia: Secondary | ICD-10-CM | POA: Diagnosis not present

## 2017-08-30 ENCOUNTER — Encounter: Payer: PPO | Admitting: Podiatry

## 2017-09-12 NOTE — Progress Notes (Signed)
This encounter was created in error - please disregard.

## 2017-10-04 DIAGNOSIS — R5382 Chronic fatigue, unspecified: Secondary | ICD-10-CM | POA: Diagnosis not present

## 2017-10-04 DIAGNOSIS — E782 Mixed hyperlipidemia: Secondary | ICD-10-CM | POA: Diagnosis not present

## 2017-10-04 DIAGNOSIS — E876 Hypokalemia: Secondary | ICD-10-CM | POA: Diagnosis not present

## 2017-10-04 DIAGNOSIS — R42 Dizziness and giddiness: Secondary | ICD-10-CM | POA: Diagnosis not present

## 2017-10-04 DIAGNOSIS — D519 Vitamin B12 deficiency anemia, unspecified: Secondary | ICD-10-CM | POA: Diagnosis not present

## 2017-10-04 DIAGNOSIS — R5383 Other fatigue: Secondary | ICD-10-CM | POA: Diagnosis not present

## 2017-10-04 DIAGNOSIS — Z9189 Other specified personal risk factors, not elsewhere classified: Secondary | ICD-10-CM | POA: Diagnosis not present

## 2017-10-04 DIAGNOSIS — N183 Chronic kidney disease, stage 3 (moderate): Secondary | ICD-10-CM | POA: Diagnosis not present

## 2017-10-04 DIAGNOSIS — K210 Gastro-esophageal reflux disease with esophagitis: Secondary | ICD-10-CM | POA: Diagnosis not present

## 2017-10-04 DIAGNOSIS — M797 Fibromyalgia: Secondary | ICD-10-CM | POA: Diagnosis not present

## 2017-10-10 DIAGNOSIS — F331 Major depressive disorder, recurrent, moderate: Secondary | ICD-10-CM | POA: Diagnosis not present

## 2017-10-10 DIAGNOSIS — K623 Rectal prolapse: Secondary | ICD-10-CM | POA: Diagnosis not present

## 2017-10-10 DIAGNOSIS — Z6831 Body mass index (BMI) 31.0-31.9, adult: Secondary | ICD-10-CM | POA: Diagnosis not present

## 2017-10-10 DIAGNOSIS — N183 Chronic kidney disease, stage 3 (moderate): Secondary | ICD-10-CM | POA: Diagnosis not present

## 2017-10-10 DIAGNOSIS — Z9189 Other specified personal risk factors, not elsewhere classified: Secondary | ICD-10-CM | POA: Diagnosis not present

## 2017-10-10 DIAGNOSIS — R0602 Shortness of breath: Secondary | ICD-10-CM | POA: Diagnosis not present

## 2017-10-10 DIAGNOSIS — G35 Multiple sclerosis: Secondary | ICD-10-CM | POA: Diagnosis not present

## 2017-10-10 DIAGNOSIS — E782 Mixed hyperlipidemia: Secondary | ICD-10-CM | POA: Diagnosis not present

## 2017-10-10 DIAGNOSIS — I7300 Raynaud's syndrome without gangrene: Secondary | ICD-10-CM | POA: Diagnosis not present

## 2017-10-10 DIAGNOSIS — M797 Fibromyalgia: Secondary | ICD-10-CM | POA: Diagnosis not present

## 2017-10-10 DIAGNOSIS — G43009 Migraine without aura, not intractable, without status migrainosus: Secondary | ICD-10-CM | POA: Diagnosis not present

## 2017-10-10 DIAGNOSIS — F900 Attention-deficit hyperactivity disorder, predominantly inattentive type: Secondary | ICD-10-CM | POA: Diagnosis not present

## 2017-10-10 DIAGNOSIS — K219 Gastro-esophageal reflux disease without esophagitis: Secondary | ICD-10-CM | POA: Diagnosis not present

## 2017-10-26 ENCOUNTER — Other Ambulatory Visit (HOSPITAL_COMMUNITY): Payer: Self-pay | Admitting: Family Medicine

## 2017-10-26 DIAGNOSIS — Z1231 Encounter for screening mammogram for malignant neoplasm of breast: Secondary | ICD-10-CM

## 2017-10-27 NOTE — Progress Notes (Signed)
NEUROLOGY CONSULTATION NOTE  CARLISHA WISLER MRN: [id number] DOB: 1965-06-22  Referring provider: Gar Ponto, MD Primary care provider: Gar Ponto, MD  Reason for consult:  Multiple sclerosis  HISTORY OF PRESENT ILLNESS: Nancy Byrd is a 52 year old female with rheumatoid arthritis who presents for multiple sclerosis.  History supplemented by previous neurologist's note.  She is accompanied by her mother who supplements history.  She was diagnosed with multiple sclerosis in 2004 after presenting with dizziness.  Diagnosis was made via MRI and lumbar puncture for CSF analysis.  She has had 3 episodes of optic neuritis.  Last treatment with steroids was in 2009.  She has had prior episodes of paresthesias.  She had left foot surgery.  She underwent cervical fusion in September 2017 for multilevel spinal stenosis from C3 through C6.  Past disease modifying therapy:  Rebif (taken for just a year; side effects, anaphylactic reaction) Current disease modifying therapy:  No.  Since her anaphylactic reaction with Rebif, she defers trying any other disease modifying therapy.  Instead, she follows a modified diet free of chemicals and metals.  Vision:  Okay Motor:  Weakness in the legs Sensory: No issues. Pain:  No Gait:  Right leg drags, falls Bowel/bladder:  Neurogenic bladder/incontinence.  Better on Myrbetriq Fatigue:  Yes.  On Adderall and Nuvigil Cognition:  Memory poor. Mood:  Depression and anxiety Other symptoms:  Tremor:  Tremors in the hands and head.  Uncle had tremor.  Propranolol and topiramate have been ineffective.  Migraines.  Restless Leg Syndrome  Current medication:  Cymbalta 120mg  daily (depression), Adderall XR 30mg  daily (fatigue), Nuvigil 250mg  daily (fatigue), Myrbetriq 50mg  (for neurogenic bladder), ropinirole 0.25mg  as needed (RLS), topiramate 200mg  twice daily (migraines), Tylenol twice daily (back pain), Aleve (daily for back pain), D3 5000 IU  daily Past medication:  Gabapentin, propranolol ER 80mg  (for tremor, ineffective)  Imaging (personally reviewed): 03/20/15 MRI BRAIN W/WO:  Non-enhancing scattered T2/FLAIR hyperintensities in the periventricular, juxtacrotical, subcortical and deep white matter of the hemispheres and left cerebral peduncle, stable compared to prior MRI from 02/26/07. 03/20/15 MRI CERVICAL W/WO:  Spinal cord with no signal abnormality.  Mild spinal stenosis at C4-C5 with possible impingement of left C5 nerve root and moderate spinal stenosis at C5-C6 with possible right C6 nerve root impingement. 11/03/15 MRI CERVICAL WO:  Cervical spinal stenosis at C3-C4, C4-C5 and C5-C6 with spinal cord mass effect.  Father had MS.  PAST MEDICAL HISTORY: Past Medical History:  Diagnosis Date  . Carpal tunnel syndrome    right hand  . Collagen vascular disease ([place])   . DDD (degenerative disc disease), cervical   . Depression   . GERD (gastroesophageal reflux disease)   . Heart murmur   . Hypercholesterolemia   . Lumbar disc herniation    L1-L2; L4-L5  . Migraines   . Multiple sclerosis ([place])   . Neuropathy   . Optic neuritis   . Pernicious anemia   . Rheumatoid arthritis ([place])    right hand  . Spinal stenosis   . Tenosynovitis     PAST SURGICAL HISTORY: Past Surgical History:  Procedure Laterality Date  . APPENDECTOMY  06/07/04  . CARDIAC CATHETERIZATION  06/13/06  . CHOLECYSTECTOMY  1989  . NASAL FRACTURE SURGERY     as a child  . TENOSYNOVECTOMY Bilateral 1985  . TONSILECTOMY, ADENOIDECTOMY, BILATERAL MYRINGOTOMY AND TUBES     as a child  . TOTAL ABDOMINAL HYSTERECTOMY  06/07/04    MEDICATIONS: Current  Outpatient Medications on File Prior to Visit  Medication Sig Dispense Refill  . acetaminophen (TYLENOL) 500 MG tablet Take 1,000 mg by mouth every 8 (eight) hours as needed.    . ALPRAZolam (XANAX) 0.5 MG tablet Take 0.5 mg by mouth at bedtime as needed for anxiety.    Marland Kitchen amphetamine-dextroamphetamine  (ADDERALL XR) 30 MG 24 hr capsule Take 30 mg by mouth daily.     . Armodafinil 250 MG tablet Take 1 tablet (250 mg total) by mouth daily. 30 tablet 5  . Ascorbic Acid (VITAMIN C) 1000 MG tablet Take 1,000 mg by mouth daily.    Marland Kitchen aspirin EC 81 MG tablet Take 81 mg by mouth daily.    . busPIRone (BUSPAR) 10 MG tablet     . Calcium Carbonate-Vitamin D (CALCIUM + D PO) Take 1,200 mg by mouth daily.    . cetirizine (ZYRTEC) 10 MG tablet Take 10 mg by mouth daily.    . Cyanocobalamin (VITAMIN B-12 IJ) Inject as directed every 30 (thirty) days.    . DULoxetine (CYMBALTA) 60 MG capsule Take 2 capsules (120 mg total) by mouth daily. 60 capsule 11  . EPINEPHrine 0.3 mg/0.3 mL IJ SOAJ injection Inject 0.3 mg into the muscle once.    . lactase (LACTAID) 3000 UNITS tablet Take by mouth 3 (three) times daily with meals.    . meclizine (ANTIVERT) 25 MG tablet Take 25 mg by mouth 3 (three) times daily as needed for dizziness.    Marland Kitchen MYRBETRIQ 50 MG TB24 tablet TAKE ONE (1) TABLET BY MOUTH EVERY DAY 90 tablet 3  . omeprazole (PRILOSEC) 20 MG capsule Take 20 mg by mouth daily.    . ondansetron (ZOFRAN) 4 MG tablet Take 1 mg by mouth every 8 (eight) hours as needed for nausea or vomiting.    Marland Kitchen perphenazine-amitriptyline (ETRAFON/TRIAVIL) 4-25 MG TABS tablet Take 1 tablet by mouth 2 (two) times daily.    . Probiotic Product (ALIGN PO) Take by mouth.    . propranolol ER (INDERAL LA) 80 MG 24 hr capsule Take 1 capsule (80 mg total) by mouth daily. 30 capsule 11  . RABEprazole (ACIPHEX) 20 MG tablet Take 1 tablet (20 mg total) by mouth daily. 30 tablet 11  . rOPINIRole (REQUIP) 0.25 MG tablet Take 1 tablet (0.25 mg total) by mouth as needed. 90 tablet 5  . scopolamine (TRANSDERM-SCOP) 1 MG/3DAYS Place 1 patch onto the skin every 3 (three) days.    Marland Kitchen SOYA LECITHIN PO Take 1,200 mg by mouth daily.    Marland Kitchen thiamine (VITAMIN B-1) 50 MG tablet Take 50 mg by mouth daily.    Marland Kitchen topiramate (TOPAMAX) 200 MG tablet Take 200 mg by  mouth 2 (two) times daily.    Marland Kitchen torsemide (DEMADEX) 20 MG tablet Take 20 mg by mouth 2 (two) times daily.    Marland Kitchen UBIQUINOL PO Take by mouth.    . zolmitriptan (ZOMIG) 5 MG tablet Take 5 mg by mouth as needed for migraine.     No current facility-administered medications on file prior to visit.     ALLERGIES: Allergies  Allergen Reactions  . Enbrel [Etanercept] Anaphylaxis  . Interferon Beta-1a Anaphylaxis  . Other Anaphylaxis    rebiff Bee stings  . Betadine [Povidone Iodine]   . Shellfish Allergy   . Tdap [Tetanus-Diphth-Acell Pertussis]     Vomiting, nausea, dizzy  . Amoxicillin Rash  . Iohexol Rash     Desc: ANAPHYLACTIC REACTION TO IODINE  Desc: ANAPHYLACTIC REACTION  TO IODINE     FAMILY HISTORY: Family History  Problem Relation Age of Onset  . Colon cancer Neg Hx   . Colon polyps Neg Hx    SOCIAL HISTORY: Social History   Socioeconomic History  . Marital status: Widowed    Spouse name: Not on file  . Number of children: Not on file  . Years of education: Not on file  . Highest education level: Not on file  Occupational History  . Not on file  Social Needs  . Financial resource strain: Not on file  . Food insecurity:    Worry: Not on file    Inability: Not on file  . Transportation needs:    Medical: Not on file    Non-medical: Not on file  Tobacco Use  . Smoking status: Never Smoker  . Smokeless tobacco: Never Used  Substance and Sexual Activity  . Alcohol use: No    Alcohol/week: 0.0 standard drinks  . Drug use: No  . Sexual activity: Not on file  Lifestyle  . Physical activity:    Days per week: Not on file    Minutes per session: Not on file  . Stress: Not on file  Relationships  . Social connections:    Talks on phone: Not on file    Gets together: Not on file    Attends religious service: Not on file    Active member of club or organization: Not on file    Attends meetings of clubs or organizations: Not on file    Relationship status:  Not on file  . Intimate partner violence:    Fear of current or ex partner: Not on file    Emotionally abused: Not on file    Physically abused: Not on file    Forced sexual activity: Not on file  Other Topics Concern  . Not on file  Social History Narrative  . Not on file    REVIEW OF SYSTEMS: Constitutional: No fevers, chills, or sweats, no generalized fatigue, change in appetite Eyes: No visual changes, double vision, eye pain Ear, nose and throat: No hearing loss, ear pain, nasal congestion, sore throat Cardiovascular: No chest pain, palpitations Respiratory:  No shortness of breath at rest or with exertion, wheezes GastrointestinaI: No nausea, vomiting, diarrhea, abdominal pain, fecal incontinence Genitourinary:  Urinary incontinence without Myrbetriq Musculoskeletal:  No neck pain, back pain Integumentary: No rash, pruritus, skin lesions Neurological: as above Psychiatric: depression, anxiety Endocrine: No palpitations, fatigue, diaphoresis, mood swings, change in appetite, change in weight, increased thirst Hematologic/Lymphatic:  No purpura, petechiae. Allergic/Immunologic: no itchy/runny eyes, nasal congestion, recent allergic reactions, rashes  PHYSICAL EXAM: Blood pressure 130/90, pulse 77, height 5\' 6"  (1.676 m), weight 183 lb 4 oz (83.1 kg), SpO2 98 %. General: No acute distress.  Patient appears well-nourished. Head:  Normocephalic/atraumatic Eyes:  fundi examined but not visualized Neck: supple, no paraspinal tenderness, full range of motion Back: No paraspinal tenderness Heart: regular rate and rhythm Lungs: Clear to auscultation bilaterally. Vascular: No carotid bruits. Neurological Exam: Mental status: alert and oriented to person, place, and time, recent and remote memory intact, fund of knowledge intact, attention and concentration intact, speech fluent and not dysarthric, language intact. Cranial nerves: CN I: not tested CN II: pupils equal, round and  reactive to light, visual fields intact CN III, IV, VI:  full range of motion, no nystagmus, no ptosis CN V: facial sensation intact CN VII: upper and lower face symmetric CN VIII: hearing intact CN  IX, X: gag intact, uvula midline CN XI: sternocleidomastoid and trapezius muscles intact CN XII: tongue midline Bulk & Tone: increased slightly in legs, no fasciculations. Motor:  Tremor in head.  Postural and kinetic tremor in hands.  Left foot drop.  Otherwise, 5/5 throughout  Sensation:  Pinprick and vibration sensation intact. Deep Tendon Reflexes:  2+ in upper extremities, 3+ lower extremities, toes downgoing.  Finger to nose testing:  Without dysmetria.  Heel to shin:  Without dysmetria.  Gait:  Normal station.  Slowed stride.  Able to turn, cautious tandem gait. Romberg negative.  IMPRESSION: Multiple sclerosis.  She does not wish to start DMT. Tremor  PLAN: 1.  To address tremor, we will start primidone 50mg  at bedtime.  We can increase dose if needed. 2.  Continue D3 5000 IU daily, Nuvigil, Myrbetriq 3.  Recheck MRI of brain with and without contrast 4.  Follow up in 6 months.  Thank you for allowing me to take part in the care of this patient.  Metta Clines, DO  CC:  Gar Ponto, MD

## 2017-10-31 ENCOUNTER — Encounter: Payer: Self-pay | Admitting: Neurology

## 2017-10-31 ENCOUNTER — Ambulatory Visit: Payer: PPO | Admitting: Neurology

## 2017-10-31 VITALS — BP 130/90 | HR 77 | Ht 66.0 in | Wt 183.2 lb

## 2017-10-31 DIAGNOSIS — R251 Tremor, unspecified: Secondary | ICD-10-CM

## 2017-10-31 DIAGNOSIS — G35 Multiple sclerosis: Secondary | ICD-10-CM | POA: Diagnosis not present

## 2017-10-31 MED ORDER — PRIMIDONE 50 MG PO TABS: 50.0000 mg | ORAL_TABLET | Freq: Every day | ORAL | 5 refills | Status: DC

## 2017-10-31 NOTE — Patient Instructions (Addendum)
1.  To treat tremor, take primidone 50mg  at bedtime.  We can increase dose if needed 2.  We will check another MRI of brain with and without contrast. We have sent a referral to [facility] for your MRI and they will call you directly to schedule your appt. They are located at [place]. If you need to contact them directly please call [phone]. 3.  Continue D3 5000 IU daily 4.  Follow up in 6 months

## 2017-11-02 ENCOUNTER — Ambulatory Visit (HOSPITAL_COMMUNITY): Payer: PPO

## 2017-11-08 DIAGNOSIS — M654 Radial styloid tenosynovitis [de Quervain]: Secondary | ICD-10-CM | POA: Diagnosis not present

## 2017-11-19 ENCOUNTER — Inpatient Hospital Stay: Admission: RE | Admit: 2017-11-19 | Payer: PPO | Source: Ambulatory Visit

## 2017-11-27 DIAGNOSIS — G35 Multiple sclerosis: Secondary | ICD-10-CM | POA: Diagnosis not present

## 2017-11-27 DIAGNOSIS — K219 Gastro-esophageal reflux disease without esophagitis: Secondary | ICD-10-CM | POA: Diagnosis not present

## 2017-11-27 DIAGNOSIS — M797 Fibromyalgia: Secondary | ICD-10-CM | POA: Diagnosis not present

## 2017-11-28 ENCOUNTER — Ambulatory Visit: Payer: PPO | Admitting: Neurology

## 2017-11-30 ENCOUNTER — Other Ambulatory Visit: Payer: PPO

## 2017-12-27 DIAGNOSIS — E876 Hypokalemia: Secondary | ICD-10-CM | POA: Diagnosis not present

## 2017-12-27 DIAGNOSIS — E782 Mixed hyperlipidemia: Secondary | ICD-10-CM | POA: Diagnosis not present

## 2017-12-27 DIAGNOSIS — G35 Multiple sclerosis: Secondary | ICD-10-CM | POA: Diagnosis not present

## 2018-01-12 ENCOUNTER — Ambulatory Visit (HOSPITAL_COMMUNITY)
Admission: RE | Admit: 2018-01-12 | Discharge: 2018-01-12 | Disposition: A | Discharge status: Home / Self Care | Payer: PPO | Source: Ambulatory Visit | Attending: Family Medicine | Admitting: Family Medicine

## 2018-01-12 DIAGNOSIS — Z1231 Encounter for screening mammogram for malignant neoplasm of breast: Secondary | ICD-10-CM | POA: Diagnosis not present

## 2018-01-18 DIAGNOSIS — G35 Multiple sclerosis: Secondary | ICD-10-CM | POA: Diagnosis not present

## 2018-01-18 DIAGNOSIS — F900 Attention-deficit hyperactivity disorder, predominantly inattentive type: Secondary | ICD-10-CM | POA: Diagnosis not present

## 2018-01-18 DIAGNOSIS — R0602 Shortness of breath: Secondary | ICD-10-CM | POA: Diagnosis not present

## 2018-01-18 DIAGNOSIS — I7300 Raynaud's syndrome without gangrene: Secondary | ICD-10-CM | POA: Diagnosis not present

## 2018-01-18 DIAGNOSIS — G43009 Migraine without aura, not intractable, without status migrainosus: Secondary | ICD-10-CM | POA: Diagnosis not present

## 2018-01-18 DIAGNOSIS — F331 Major depressive disorder, recurrent, moderate: Secondary | ICD-10-CM | POA: Diagnosis not present

## 2018-01-18 DIAGNOSIS — Z6830 Body mass index (BMI) 30.0-30.9, adult: Secondary | ICD-10-CM | POA: Diagnosis not present

## 2018-01-18 DIAGNOSIS — K623 Rectal prolapse: Secondary | ICD-10-CM | POA: Diagnosis not present

## 2018-02-05 DIAGNOSIS — R040 Epistaxis: Secondary | ICD-10-CM | POA: Diagnosis not present

## 2018-03-29 DIAGNOSIS — E782 Mixed hyperlipidemia: Secondary | ICD-10-CM | POA: Diagnosis not present

## 2018-03-29 DIAGNOSIS — K219 Gastro-esophageal reflux disease without esophagitis: Secondary | ICD-10-CM | POA: Diagnosis not present

## 2018-04-09 DIAGNOSIS — R5382 Chronic fatigue, unspecified: Secondary | ICD-10-CM | POA: Diagnosis not present

## 2018-04-09 DIAGNOSIS — D519 Vitamin B12 deficiency anemia, unspecified: Secondary | ICD-10-CM | POA: Diagnosis not present

## 2018-04-09 DIAGNOSIS — M797 Fibromyalgia: Secondary | ICD-10-CM | POA: Diagnosis not present

## 2018-04-09 DIAGNOSIS — R42 Dizziness and giddiness: Secondary | ICD-10-CM | POA: Diagnosis not present

## 2018-04-09 DIAGNOSIS — E876 Hypokalemia: Secondary | ICD-10-CM | POA: Diagnosis not present

## 2018-04-09 DIAGNOSIS — R5383 Other fatigue: Secondary | ICD-10-CM | POA: Diagnosis not present

## 2018-04-09 DIAGNOSIS — N183 Chronic kidney disease, stage 3 (moderate): Secondary | ICD-10-CM | POA: Diagnosis not present

## 2018-04-09 DIAGNOSIS — K210 Gastro-esophageal reflux disease with esophagitis: Secondary | ICD-10-CM | POA: Diagnosis not present

## 2018-04-09 DIAGNOSIS — E782 Mixed hyperlipidemia: Secondary | ICD-10-CM | POA: Diagnosis not present

## 2018-04-12 DIAGNOSIS — Z0001 Encounter for general adult medical examination with abnormal findings: Secondary | ICD-10-CM | POA: Diagnosis not present

## 2018-04-12 DIAGNOSIS — Z1212 Encounter for screening for malignant neoplasm of rectum: Secondary | ICD-10-CM | POA: Diagnosis not present

## 2018-04-12 DIAGNOSIS — Z6830 Body mass index (BMI) 30.0-30.9, adult: Secondary | ICD-10-CM | POA: Diagnosis not present

## 2018-04-25 DIAGNOSIS — R300 Dysuria: Secondary | ICD-10-CM | POA: Diagnosis not present

## 2018-04-27 DIAGNOSIS — E785 Hyperlipidemia, unspecified: Secondary | ICD-10-CM | POA: Diagnosis not present

## 2018-04-27 DIAGNOSIS — F331 Major depressive disorder, recurrent, moderate: Secondary | ICD-10-CM | POA: Diagnosis not present

## 2018-05-01 ENCOUNTER — Ambulatory Visit: Payer: PPO | Admitting: Neurology

## 2018-05-10 ENCOUNTER — Encounter: Payer: Self-pay | Admitting: General Surgery

## 2018-05-10 ENCOUNTER — Other Ambulatory Visit: Payer: Self-pay

## 2018-05-10 ENCOUNTER — Ambulatory Visit (INDEPENDENT_AMBULATORY_CARE_PROVIDER_SITE_OTHER): Payer: PPO | Admitting: General Surgery

## 2018-05-10 VITALS — BP 138/98 | HR 90 | Temp 97.7°F | Resp 20 | Wt 177.6 lb

## 2018-05-10 DIAGNOSIS — D1724 Benign lipomatous neoplasm of skin and subcutaneous tissue of left leg: Secondary | ICD-10-CM | POA: Diagnosis not present

## 2018-05-10 NOTE — Patient Instructions (Signed)
Lipoma  A lipoma is a noncancerous (benign) tumor that is made up of fat cells. This is a very common type of soft-tissue growth. Lipomas are usually found under the skin (subcutaneous). They may occur in any tissue of the body that contains fat. Common areas for lipomas to appear include the back, shoulders, buttocks, and thighs.  Lipomas grow slowly, and they are usually painless. Most lipomas do not cause problems and do not require treatment. What are the causes? The cause of this condition is not known. What increases the risk? You are more likely to develop this condition if:  You are 32-36 years old.  You have a family history of lipomas. What are the signs or symptoms? A lipoma usually appears as a small, round bump under the skin. In most cases, the lump will:  Feel soft or rubbery.  Not cause pain or other symptoms. However, if a lipoma is located in an area where it pushes on nerves, it can become painful or cause other symptoms. How is this diagnosed? A lipoma can usually be diagnosed with a physical exam. You may also have tests to confirm the diagnosis and to rule out other conditions. Tests may include:  Imaging tests, such as a CT scan or MRI.  Removal of a tissue sample to be looked at under a microscope (biopsy). How is this treated? Treatment for this condition depends on the size of the lipoma and whether it is causing any symptoms.  For small lipomas that are not causing problems, no treatment is needed.  If a lipoma is bigger or it causes problems, surgery may be done to remove the lipoma. Lipomas can also be removed to improve appearance. Most often, the procedure is done after applying a medicine that numbs the area (local anesthetic). Follow these instructions at home:  Watch your lipoma for any changes.  Keep all follow-up visits as told by your health care provider. This is important. Contact a health care provider if:  Your lipoma becomes larger or  hard.  Your lipoma becomes painful, red, or increasingly swollen. These could be signs of infection or a more serious condition. Get help right away if:  You develop tingling or numbness in an area near the lipoma. This could indicate that the lipoma is causing nerve damage. Summary  A lipoma is a noncancerous tumor that is made up of fat cells.  Most lipomas do not cause problems and do not require treatment.  If a lipoma is bigger or it causes problems, surgery may be done to remove the lipoma. This information is not intended to replace advice given to you by your health care provider. Make sure you discuss any questions you have with your health care provider. Document Released: 02/04/2002 Document Revised: 01/31/2017 Document Reviewed: 01/31/2017 Elsevier Interactive Patient Education  2019 [employer].   Lipoma Removal Lipoma removal is a surgical procedure to remove a noncancerous (benign) tumor that is made up of fat cells (lipoma). Most lipomas are small and painless and do not require treatment. They can form in many areas of the body but are most common under the skin of the back, shoulders, arms, and thighs. You may need lipoma removal if you have a lipoma that is large, growing, or causing discomfort. Lipoma removal may also be done for cosmetic reasons. Tell a health care provider about:  Any allergies you have.  All medicines you are taking, including vitamins, herbs, eye drops, creams, and over-the-counter medicines.  Any problems  you or family members have had with anesthetic medicines.  Any blood disorders you have.  Any surgeries you have had.  Any medical conditions you have.  Whether you are pregnant or may be pregnant. What are the risks? Generally, this is a safe procedure. However, problems may occur, including:  Infection.  Bleeding.  Allergic reactions to medicines.  Damage to nerves or blood vessels near the lipoma.  Scarring. Medicines   Ask your health care provider about: ? Changing or stopping your regular medicines. This is especially important if you are taking diabetes medicines or blood thinners. ? Taking medicines such as aspirin and ibuprofen. These medicines can thin your blood. Do not take these medicines before your procedure if your health care provider instructs you not to.  You may be given antibiotic medicine to help prevent infection. General instructions  Ask your health care provider how your surgical site will be marked or identified.  You will have a physical exam. Your health care provider will check the size of the lipoma and whether it can be moved easily.  You may have imaging tests, such as: ? X-rays. ? CT scan. ? MRI.  Plan to have someone take you home from the hospital or clinic. What happens during the procedure?  To reduce your risk of infection: ? Your health care team will wash or sanitize their hands. ? Your skin will be washed with soap.  You will be given one or more of the following: ? A medicine to help you relax (sedative). ? A medicine to numb the area (local anesthetic). ? A medicine to make you fall asleep (general anesthetic). ? A medicine that is injected into an area of your body to numb everything below the injection site (regional anesthetic).  An incision will be made over the lipoma or very near the lipoma. The incision may be made in a natural skin line or crease.  Tissues, nerves, and blood vessels near the lipoma will be moved out of the way.  The lipoma and the capsule that surrounds it will be separated from the surrounding tissues.  The lipoma will be removed.  The incision may be closed with stitches (sutures).  A bandage (dressing) will be placed over the incision. What happens after the procedure?  Do not drive for 24 hours if you received a sedative.  Your blood pressure, heart rate, breathing rate, and blood oxygen level will be monitored until  the medicines you were given have worn off. This information is not intended to replace advice given to you by your health care provider. Make sure you discuss any questions you have with your health care provider. Document Released: 04/30/2015 Document Revised: 07/23/2015 Document Reviewed: 04/30/2015 Elsevier Interactive Patient Education  2019 [employer].
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
## 2018-05-28 ENCOUNTER — Ambulatory Visit: Admit: 2018-05-28 | Payer: PPO | Admitting: General Surgery

## 2018-05-28 DIAGNOSIS — F331 Major depressive disorder, recurrent, moderate: Secondary | ICD-10-CM | POA: Diagnosis not present

## 2018-05-28 DIAGNOSIS — E782 Mixed hyperlipidemia: Secondary | ICD-10-CM | POA: Diagnosis not present

## 2018-05-28 SURGERY — MINOR EXCISION LIPOMA
Anesthesia: LOCAL | Laterality: Left

## 2018-06-28 DIAGNOSIS — F331 Major depressive disorder, recurrent, moderate: Secondary | ICD-10-CM | POA: Diagnosis not present

## 2018-06-28 DIAGNOSIS — E782 Mixed hyperlipidemia: Secondary | ICD-10-CM | POA: Diagnosis not present

## 2018-07-09 DIAGNOSIS — F900 Attention-deficit hyperactivity disorder, predominantly inattentive type: Secondary | ICD-10-CM | POA: Diagnosis not present

## 2018-07-09 DIAGNOSIS — E782 Mixed hyperlipidemia: Secondary | ICD-10-CM | POA: Diagnosis not present

## 2018-07-09 DIAGNOSIS — K219 Gastro-esophageal reflux disease without esophagitis: Secondary | ICD-10-CM | POA: Diagnosis not present

## 2018-07-09 DIAGNOSIS — M797 Fibromyalgia: Secondary | ICD-10-CM | POA: Diagnosis not present

## 2018-07-09 DIAGNOSIS — G35 Multiple sclerosis: Secondary | ICD-10-CM | POA: Diagnosis not present

## 2018-07-09 DIAGNOSIS — I7300 Raynaud's syndrome without gangrene: Secondary | ICD-10-CM | POA: Diagnosis not present

## 2018-07-09 DIAGNOSIS — F331 Major depressive disorder, recurrent, moderate: Secondary | ICD-10-CM | POA: Diagnosis not present

## 2018-07-09 DIAGNOSIS — K623 Rectal prolapse: Secondary | ICD-10-CM | POA: Diagnosis not present

## 2018-07-28 DIAGNOSIS — D519 Vitamin B12 deficiency anemia, unspecified: Secondary | ICD-10-CM | POA: Diagnosis not present

## 2018-07-28 DIAGNOSIS — E782 Mixed hyperlipidemia: Secondary | ICD-10-CM | POA: Diagnosis not present

## 2018-07-28 DIAGNOSIS — F331 Major depressive disorder, recurrent, moderate: Secondary | ICD-10-CM | POA: Diagnosis not present

## 2018-08-17 DIAGNOSIS — S92325K Nondisplaced fracture of second metatarsal bone, left foot, subsequent encounter for fracture with nonunion: Secondary | ICD-10-CM | POA: Diagnosis not present

## 2018-08-17 DIAGNOSIS — G5762 Lesion of plantar nerve, left lower limb: Secondary | ICD-10-CM | POA: Diagnosis not present

## 2018-08-17 DIAGNOSIS — M79672 Pain in left foot: Secondary | ICD-10-CM | POA: Diagnosis not present

## 2018-08-21 ENCOUNTER — Other Ambulatory Visit: Payer: Self-pay | Admitting: Orthopedic Surgery

## 2018-08-21 DIAGNOSIS — S92325K Nondisplaced fracture of second metatarsal bone, left foot, subsequent encounter for fracture with nonunion: Secondary | ICD-10-CM

## 2018-08-28 DIAGNOSIS — F419 Anxiety disorder, unspecified: Secondary | ICD-10-CM | POA: Diagnosis not present

## 2018-08-28 DIAGNOSIS — N183 Chronic kidney disease, stage 3 (moderate): Secondary | ICD-10-CM | POA: Diagnosis not present

## 2018-08-28 DIAGNOSIS — E782 Mixed hyperlipidemia: Secondary | ICD-10-CM | POA: Diagnosis not present

## 2018-09-05 ENCOUNTER — Other Ambulatory Visit: Payer: PPO

## 2018-09-06 ENCOUNTER — Other Ambulatory Visit: Payer: Self-pay

## 2018-09-06 ENCOUNTER — Ambulatory Visit
Admission: RE | Admit: 2018-09-06 | Discharge: 2018-09-06 | Disposition: A | Discharge status: Home / Self Care | Payer: PPO | Source: Ambulatory Visit | Attending: Orthopedic Surgery | Admitting: Orthopedic Surgery

## 2018-09-06 DIAGNOSIS — S92325K Nondisplaced fracture of second metatarsal bone, left foot, subsequent encounter for fracture with nonunion: Secondary | ICD-10-CM

## 2018-09-06 DIAGNOSIS — S92322D Displaced fracture of second metatarsal bone, left foot, subsequent encounter for fracture with routine healing: Secondary | ICD-10-CM | POA: Diagnosis not present

## 2018-09-21 DIAGNOSIS — G5762 Lesion of plantar nerve, left lower limb: Secondary | ICD-10-CM | POA: Diagnosis not present

## 2018-09-21 DIAGNOSIS — S92325K Nondisplaced fracture of second metatarsal bone, left foot, subsequent encounter for fracture with nonunion: Secondary | ICD-10-CM | POA: Diagnosis not present

## 2018-09-28 ENCOUNTER — Telehealth: Payer: Self-pay | Admitting: Podiatry

## 2018-09-28 DIAGNOSIS — E785 Hyperlipidemia, unspecified: Secondary | ICD-10-CM | POA: Diagnosis not present

## 2018-09-28 DIAGNOSIS — I10 Essential (primary) hypertension: Secondary | ICD-10-CM | POA: Diagnosis not present

## 2018-09-28 NOTE — Telephone Encounter (Signed)
Following up on medical records request faxed on 08 July.

## 2018-10-09 DIAGNOSIS — G252 Other specified forms of tremor: Secondary | ICD-10-CM | POA: Diagnosis not present

## 2018-10-09 DIAGNOSIS — Z6830 Body mass index (BMI) 30.0-30.9, adult: Secondary | ICD-10-CM | POA: Diagnosis not present

## 2018-10-09 DIAGNOSIS — N183 Chronic kidney disease, stage 3 (moderate): Secondary | ICD-10-CM | POA: Diagnosis not present

## 2018-10-09 DIAGNOSIS — I7300 Raynaud's syndrome without gangrene: Secondary | ICD-10-CM | POA: Diagnosis not present

## 2018-10-09 DIAGNOSIS — R0602 Shortness of breath: Secondary | ICD-10-CM | POA: Diagnosis not present

## 2018-10-09 DIAGNOSIS — K623 Rectal prolapse: Secondary | ICD-10-CM | POA: Diagnosis not present

## 2018-10-09 DIAGNOSIS — F331 Major depressive disorder, recurrent, moderate: Secondary | ICD-10-CM | POA: Diagnosis not present

## 2018-10-09 DIAGNOSIS — F900 Attention-deficit hyperactivity disorder, predominantly inattentive type: Secondary | ICD-10-CM | POA: Diagnosis not present

## 2018-10-11 DIAGNOSIS — S92325K Nondisplaced fracture of second metatarsal bone, left foot, subsequent encounter for fracture with nonunion: Secondary | ICD-10-CM | POA: Diagnosis not present

## 2018-10-16 DIAGNOSIS — H25811 Combined forms of age-related cataract, right eye: Secondary | ICD-10-CM | POA: Diagnosis not present

## 2018-10-16 DIAGNOSIS — H524 Presbyopia: Secondary | ICD-10-CM | POA: Diagnosis not present

## 2018-10-16 DIAGNOSIS — H5203 Hypermetropia, bilateral: Secondary | ICD-10-CM | POA: Diagnosis not present

## 2018-10-16 DIAGNOSIS — H25813 Combined forms of age-related cataract, bilateral: Secondary | ICD-10-CM | POA: Diagnosis not present

## 2018-10-16 DIAGNOSIS — H02831 Dermatochalasis of right upper eyelid: Secondary | ICD-10-CM | POA: Diagnosis not present

## 2018-10-16 DIAGNOSIS — H25812 Combined forms of age-related cataract, left eye: Secondary | ICD-10-CM | POA: Diagnosis not present

## 2018-10-16 DIAGNOSIS — H02834 Dermatochalasis of left upper eyelid: Secondary | ICD-10-CM | POA: Diagnosis not present

## 2018-10-29 DIAGNOSIS — F331 Major depressive disorder, recurrent, moderate: Secondary | ICD-10-CM | POA: Diagnosis not present

## 2018-10-29 DIAGNOSIS — E782 Mixed hyperlipidemia: Secondary | ICD-10-CM | POA: Diagnosis not present

## 2018-10-29 DIAGNOSIS — M79672 Pain in left foot: Secondary | ICD-10-CM | POA: Diagnosis not present

## 2018-10-29 DIAGNOSIS — G35 Multiple sclerosis: Secondary | ICD-10-CM | POA: Diagnosis not present

## 2018-11-26 DIAGNOSIS — H02831 Dermatochalasis of right upper eyelid: Secondary | ICD-10-CM | POA: Diagnosis not present

## 2018-11-26 DIAGNOSIS — H02834 Dermatochalasis of left upper eyelid: Secondary | ICD-10-CM | POA: Diagnosis not present

## 2018-11-28 DIAGNOSIS — F331 Major depressive disorder, recurrent, moderate: Secondary | ICD-10-CM | POA: Diagnosis not present

## 2018-11-28 DIAGNOSIS — E782 Mixed hyperlipidemia: Secondary | ICD-10-CM | POA: Diagnosis not present

## 2018-11-29 DIAGNOSIS — M79672 Pain in left foot: Secondary | ICD-10-CM | POA: Diagnosis not present

## 2018-11-29 DIAGNOSIS — S92325K Nondisplaced fracture of second metatarsal bone, left foot, subsequent encounter for fracture with nonunion: Secondary | ICD-10-CM | POA: Diagnosis not present

## 2018-12-07 ENCOUNTER — Other Ambulatory Visit (HOSPITAL_COMMUNITY): Payer: Self-pay | Admitting: Family Medicine

## 2018-12-07 DIAGNOSIS — Z1231 Encounter for screening mammogram for malignant neoplasm of breast: Secondary | ICD-10-CM

## 2018-12-12 DIAGNOSIS — B029 Zoster without complications: Secondary | ICD-10-CM | POA: Diagnosis not present

## 2019-01-16 DIAGNOSIS — G35 Multiple sclerosis: Secondary | ICD-10-CM | POA: Diagnosis not present

## 2019-01-16 DIAGNOSIS — F331 Major depressive disorder, recurrent, moderate: Secondary | ICD-10-CM | POA: Diagnosis not present

## 2019-01-17 ENCOUNTER — Ambulatory Visit (HOSPITAL_COMMUNITY): Payer: PPO

## 2019-01-28 DIAGNOSIS — F419 Anxiety disorder, unspecified: Secondary | ICD-10-CM | POA: Diagnosis not present

## 2019-01-28 DIAGNOSIS — F331 Major depressive disorder, recurrent, moderate: Secondary | ICD-10-CM | POA: Diagnosis not present

## 2019-02-06 DIAGNOSIS — K623 Rectal prolapse: Secondary | ICD-10-CM | POA: Diagnosis not present

## 2019-02-06 DIAGNOSIS — R0602 Shortness of breath: Secondary | ICD-10-CM | POA: Diagnosis not present

## 2019-02-06 DIAGNOSIS — G35 Multiple sclerosis: Secondary | ICD-10-CM | POA: Diagnosis not present

## 2019-02-06 DIAGNOSIS — I7300 Raynaud's syndrome without gangrene: Secondary | ICD-10-CM | POA: Diagnosis not present

## 2019-02-06 DIAGNOSIS — F900 Attention-deficit hyperactivity disorder, predominantly inattentive type: Secondary | ICD-10-CM | POA: Diagnosis not present

## 2019-02-06 DIAGNOSIS — G43009 Migraine without aura, not intractable, without status migrainosus: Secondary | ICD-10-CM | POA: Diagnosis not present

## 2019-02-06 DIAGNOSIS — E782 Mixed hyperlipidemia: Secondary | ICD-10-CM | POA: Diagnosis not present

## 2019-02-06 DIAGNOSIS — F331 Major depressive disorder, recurrent, moderate: Secondary | ICD-10-CM | POA: Diagnosis not present

## 2019-02-28 DIAGNOSIS — F419 Anxiety disorder, unspecified: Secondary | ICD-10-CM | POA: Diagnosis not present

## 2019-02-28 DIAGNOSIS — F331 Major depressive disorder, recurrent, moderate: Secondary | ICD-10-CM | POA: Diagnosis not present

## 2019-03-29 DIAGNOSIS — F331 Major depressive disorder, recurrent, moderate: Secondary | ICD-10-CM | POA: Diagnosis not present

## 2019-03-29 DIAGNOSIS — G35 Multiple sclerosis: Secondary | ICD-10-CM | POA: Diagnosis not present

## 2019-04-15 DIAGNOSIS — G43009 Migraine without aura, not intractable, without status migrainosus: Secondary | ICD-10-CM | POA: Diagnosis not present

## 2019-04-15 DIAGNOSIS — I7300 Raynaud's syndrome without gangrene: Secondary | ICD-10-CM | POA: Diagnosis not present

## 2019-04-15 DIAGNOSIS — Z6830 Body mass index (BMI) 30.0-30.9, adult: Secondary | ICD-10-CM | POA: Diagnosis not present

## 2019-04-15 DIAGNOSIS — R4582 Worries: Secondary | ICD-10-CM | POA: Diagnosis not present

## 2019-04-15 DIAGNOSIS — Z0001 Encounter for general adult medical examination with abnormal findings: Secondary | ICD-10-CM | POA: Diagnosis not present

## 2019-04-15 DIAGNOSIS — F900 Attention-deficit hyperactivity disorder, predominantly inattentive type: Secondary | ICD-10-CM | POA: Diagnosis not present

## 2019-04-15 DIAGNOSIS — R0602 Shortness of breath: Secondary | ICD-10-CM | POA: Diagnosis not present

## 2019-04-23 DIAGNOSIS — H02413 Mechanical ptosis of bilateral eyelids: Secondary | ICD-10-CM | POA: Diagnosis not present

## 2019-04-23 DIAGNOSIS — H53483 Generalized contraction of visual field, bilateral: Secondary | ICD-10-CM | POA: Diagnosis not present

## 2019-04-23 DIAGNOSIS — G35 Multiple sclerosis: Secondary | ICD-10-CM | POA: Diagnosis not present

## 2019-04-23 DIAGNOSIS — H02423 Myogenic ptosis of bilateral eyelids: Secondary | ICD-10-CM | POA: Diagnosis not present

## 2019-04-23 DIAGNOSIS — H02831 Dermatochalasis of right upper eyelid: Secondary | ICD-10-CM | POA: Diagnosis not present

## 2019-04-23 DIAGNOSIS — H02832 Dermatochalasis of right lower eyelid: Secondary | ICD-10-CM | POA: Diagnosis not present

## 2019-04-23 DIAGNOSIS — H57813 Brow ptosis, bilateral: Secondary | ICD-10-CM | POA: Diagnosis not present

## 2019-04-23 DIAGNOSIS — H02835 Dermatochalasis of left lower eyelid: Secondary | ICD-10-CM | POA: Diagnosis not present

## 2019-04-23 DIAGNOSIS — H0279 Other degenerative disorders of eyelid and periocular area: Secondary | ICD-10-CM | POA: Diagnosis not present

## 2019-04-23 DIAGNOSIS — H02834 Dermatochalasis of left upper eyelid: Secondary | ICD-10-CM | POA: Diagnosis not present

## 2019-04-26 DIAGNOSIS — I10 Essential (primary) hypertension: Secondary | ICD-10-CM | POA: Diagnosis not present

## 2019-04-26 DIAGNOSIS — E7849 Other hyperlipidemia: Secondary | ICD-10-CM | POA: Diagnosis not present

## 2019-05-15 DIAGNOSIS — L259 Unspecified contact dermatitis, unspecified cause: Secondary | ICD-10-CM | POA: Diagnosis not present

## 2019-05-16 DIAGNOSIS — H53483 Generalized contraction of visual field, bilateral: Secondary | ICD-10-CM | POA: Diagnosis not present

## 2019-05-29 DIAGNOSIS — F331 Major depressive disorder, recurrent, moderate: Secondary | ICD-10-CM | POA: Diagnosis not present

## 2019-05-29 DIAGNOSIS — G35 Multiple sclerosis: Secondary | ICD-10-CM | POA: Diagnosis not present

## 2019-06-28 DIAGNOSIS — L259 Unspecified contact dermatitis, unspecified cause: Secondary | ICD-10-CM | POA: Diagnosis not present

## 2019-06-28 DIAGNOSIS — G35 Multiple sclerosis: Secondary | ICD-10-CM | POA: Diagnosis not present

## 2019-07-16 DIAGNOSIS — Z0001 Encounter for general adult medical examination with abnormal findings: Secondary | ICD-10-CM | POA: Diagnosis not present

## 2019-07-24 DIAGNOSIS — G35 Multiple sclerosis: Secondary | ICD-10-CM | POA: Diagnosis not present

## 2019-07-24 DIAGNOSIS — S92325K Nondisplaced fracture of second metatarsal bone, left foot, subsequent encounter for fracture with nonunion: Secondary | ICD-10-CM | POA: Diagnosis not present

## 2019-07-24 DIAGNOSIS — F900 Attention-deficit hyperactivity disorder, predominantly inattentive type: Secondary | ICD-10-CM | POA: Diagnosis not present

## 2019-07-24 DIAGNOSIS — K623 Rectal prolapse: Secondary | ICD-10-CM | POA: Diagnosis not present

## 2019-07-24 DIAGNOSIS — M79672 Pain in left foot: Secondary | ICD-10-CM | POA: Diagnosis not present

## 2019-07-24 DIAGNOSIS — F331 Major depressive disorder, recurrent, moderate: Secondary | ICD-10-CM | POA: Diagnosis not present

## 2019-07-24 DIAGNOSIS — I7300 Raynaud's syndrome without gangrene: Secondary | ICD-10-CM | POA: Diagnosis not present

## 2019-07-24 DIAGNOSIS — R4582 Worries: Secondary | ICD-10-CM | POA: Diagnosis not present

## 2019-07-24 DIAGNOSIS — Z6830 Body mass index (BMI) 30.0-30.9, adult: Secondary | ICD-10-CM | POA: Diagnosis not present

## 2019-07-29 DIAGNOSIS — F900 Attention-deficit hyperactivity disorder, predominantly inattentive type: Secondary | ICD-10-CM | POA: Diagnosis not present

## 2019-07-29 DIAGNOSIS — M797 Fibromyalgia: Secondary | ICD-10-CM | POA: Diagnosis not present

## 2019-07-29 DIAGNOSIS — N183 Chronic kidney disease, stage 3 unspecified: Secondary | ICD-10-CM | POA: Diagnosis not present

## 2019-07-29 DIAGNOSIS — K219 Gastro-esophageal reflux disease without esophagitis: Secondary | ICD-10-CM | POA: Diagnosis not present

## 2019-07-29 DIAGNOSIS — E7849 Other hyperlipidemia: Secondary | ICD-10-CM | POA: Diagnosis not present

## 2019-08-06 DIAGNOSIS — H02831 Dermatochalasis of right upper eyelid: Secondary | ICD-10-CM | POA: Diagnosis not present

## 2019-08-06 DIAGNOSIS — H02834 Dermatochalasis of left upper eyelid: Secondary | ICD-10-CM | POA: Diagnosis not present

## 2019-08-06 DIAGNOSIS — H25813 Combined forms of age-related cataract, bilateral: Secondary | ICD-10-CM | POA: Diagnosis not present

## 2019-08-19 DIAGNOSIS — H57813 Brow ptosis, bilateral: Secondary | ICD-10-CM | POA: Diagnosis not present

## 2019-08-19 DIAGNOSIS — H0279 Other degenerative disorders of eyelid and periocular area: Secondary | ICD-10-CM | POA: Diagnosis not present

## 2019-08-19 DIAGNOSIS — H02835 Dermatochalasis of left lower eyelid: Secondary | ICD-10-CM | POA: Diagnosis not present

## 2019-08-19 DIAGNOSIS — G510 Bell's palsy: Secondary | ICD-10-CM | POA: Diagnosis not present

## 2019-08-19 DIAGNOSIS — H02423 Myogenic ptosis of bilateral eyelids: Secondary | ICD-10-CM | POA: Diagnosis not present

## 2019-08-19 DIAGNOSIS — H02834 Dermatochalasis of left upper eyelid: Secondary | ICD-10-CM | POA: Diagnosis not present

## 2019-08-19 DIAGNOSIS — H02832 Dermatochalasis of right lower eyelid: Secondary | ICD-10-CM | POA: Diagnosis not present

## 2019-08-19 DIAGNOSIS — H53483 Generalized contraction of visual field, bilateral: Secondary | ICD-10-CM | POA: Diagnosis not present

## 2019-08-19 DIAGNOSIS — G35 Multiple sclerosis: Secondary | ICD-10-CM | POA: Diagnosis not present

## 2019-08-19 DIAGNOSIS — H02831 Dermatochalasis of right upper eyelid: Secondary | ICD-10-CM | POA: Diagnosis not present

## 2019-08-19 DIAGNOSIS — H02413 Mechanical ptosis of bilateral eyelids: Secondary | ICD-10-CM | POA: Diagnosis not present

## 2019-08-28 DIAGNOSIS — N183 Chronic kidney disease, stage 3 unspecified: Secondary | ICD-10-CM | POA: Diagnosis not present

## 2019-08-28 DIAGNOSIS — M797 Fibromyalgia: Secondary | ICD-10-CM | POA: Diagnosis not present

## 2019-08-28 DIAGNOSIS — H25813 Combined forms of age-related cataract, bilateral: Secondary | ICD-10-CM | POA: Diagnosis not present

## 2019-08-28 DIAGNOSIS — K219 Gastro-esophageal reflux disease without esophagitis: Secondary | ICD-10-CM | POA: Diagnosis not present

## 2019-08-28 DIAGNOSIS — F900 Attention-deficit hyperactivity disorder, predominantly inattentive type: Secondary | ICD-10-CM | POA: Diagnosis not present

## 2019-08-28 DIAGNOSIS — E7849 Other hyperlipidemia: Secondary | ICD-10-CM | POA: Diagnosis not present

## 2019-09-04 DIAGNOSIS — H25811 Combined forms of age-related cataract, right eye: Secondary | ICD-10-CM | POA: Diagnosis not present

## 2019-09-04 DIAGNOSIS — H25812 Combined forms of age-related cataract, left eye: Secondary | ICD-10-CM | POA: Diagnosis not present

## 2019-09-04 DIAGNOSIS — H2511 Age-related nuclear cataract, right eye: Secondary | ICD-10-CM | POA: Diagnosis not present

## 2019-09-09 DIAGNOSIS — M25569 Pain in unspecified knee: Secondary | ICD-10-CM | POA: Diagnosis not present

## 2019-09-09 DIAGNOSIS — M25462 Effusion, left knee: Secondary | ICD-10-CM | POA: Diagnosis not present

## 2019-09-09 DIAGNOSIS — N3000 Acute cystitis without hematuria: Secondary | ICD-10-CM | POA: Diagnosis not present

## 2019-09-09 DIAGNOSIS — Z6828 Body mass index (BMI) 28.0-28.9, adult: Secondary | ICD-10-CM | POA: Diagnosis not present

## 2019-09-13 ENCOUNTER — Other Ambulatory Visit: Payer: Self-pay | Admitting: Orthopedic Surgery

## 2019-09-13 ENCOUNTER — Other Ambulatory Visit (HOSPITAL_COMMUNITY): Payer: Self-pay | Admitting: Orthopedic Surgery

## 2019-09-13 DIAGNOSIS — S82142A Displaced bicondylar fracture of left tibia, initial encounter for closed fracture: Secondary | ICD-10-CM | POA: Diagnosis not present

## 2019-09-16 ENCOUNTER — Other Ambulatory Visit: Payer: Self-pay | Admitting: Orthopedic Surgery

## 2019-09-16 ENCOUNTER — Other Ambulatory Visit (HOSPITAL_COMMUNITY): Payer: Self-pay | Admitting: Orthopedic Surgery

## 2019-09-16 DIAGNOSIS — S82132A Displaced fracture of medial condyle of left tibia, initial encounter for closed fracture: Secondary | ICD-10-CM

## 2019-09-18 ENCOUNTER — Other Ambulatory Visit: Payer: PPO

## 2019-09-18 ENCOUNTER — Other Ambulatory Visit: Payer: Self-pay | Admitting: Orthopedic Surgery

## 2019-09-18 DIAGNOSIS — H2512 Age-related nuclear cataract, left eye: Secondary | ICD-10-CM | POA: Diagnosis not present

## 2019-09-18 DIAGNOSIS — H25012 Cortical age-related cataract, left eye: Secondary | ICD-10-CM | POA: Diagnosis not present

## 2019-09-18 DIAGNOSIS — M25562 Pain in left knee: Secondary | ICD-10-CM

## 2019-09-19 ENCOUNTER — Other Ambulatory Visit: Payer: Self-pay

## 2019-09-19 ENCOUNTER — Ambulatory Visit
Admission: RE | Admit: 2019-09-19 | Discharge: 2019-09-19 | Disposition: A | Discharge status: Home / Self Care | Payer: PPO | Source: Ambulatory Visit | Attending: Orthopedic Surgery | Admitting: Orthopedic Surgery

## 2019-09-19 ENCOUNTER — Other Ambulatory Visit: Payer: Self-pay | Admitting: Orthopedic Surgery

## 2019-09-19 DIAGNOSIS — S82142A Displaced bicondylar fracture of left tibia, initial encounter for closed fracture: Secondary | ICD-10-CM | POA: Diagnosis not present

## 2019-09-19 DIAGNOSIS — S82122A Displaced fracture of lateral condyle of left tibia, initial encounter for closed fracture: Secondary | ICD-10-CM | POA: Diagnosis not present

## 2019-09-19 DIAGNOSIS — R937 Abnormal findings on diagnostic imaging of other parts of musculoskeletal system: Secondary | ICD-10-CM | POA: Diagnosis not present

## 2019-09-19 DIAGNOSIS — M7989 Other specified soft tissue disorders: Secondary | ICD-10-CM | POA: Diagnosis not present

## 2019-09-19 DIAGNOSIS — M25562 Pain in left knee: Secondary | ICD-10-CM

## 2019-09-23 ENCOUNTER — Encounter (HOSPITAL_COMMUNITY): Payer: Self-pay

## 2019-09-23 ENCOUNTER — Ambulatory Visit (HOSPITAL_COMMUNITY): Payer: PPO

## 2019-09-23 DIAGNOSIS — S82142D Displaced bicondylar fracture of left tibia, subsequent encounter for closed fracture with routine healing: Secondary | ICD-10-CM | POA: Diagnosis not present

## 2019-09-27 DIAGNOSIS — K219 Gastro-esophageal reflux disease without esophagitis: Secondary | ICD-10-CM | POA: Diagnosis not present

## 2019-09-27 DIAGNOSIS — N183 Chronic kidney disease, stage 3 unspecified: Secondary | ICD-10-CM | POA: Diagnosis not present

## 2019-09-27 DIAGNOSIS — M797 Fibromyalgia: Secondary | ICD-10-CM | POA: Diagnosis not present

## 2019-09-27 DIAGNOSIS — F900 Attention-deficit hyperactivity disorder, predominantly inattentive type: Secondary | ICD-10-CM | POA: Diagnosis not present

## 2019-09-27 DIAGNOSIS — E7849 Other hyperlipidemia: Secondary | ICD-10-CM | POA: Diagnosis not present

## 2019-10-01 ENCOUNTER — Ambulatory Visit (HOSPITAL_COMMUNITY): Payer: PPO

## 2019-10-02 ENCOUNTER — Other Ambulatory Visit: Payer: PPO

## 2019-10-02 DIAGNOSIS — S82142D Displaced bicondylar fracture of left tibia, subsequent encounter for closed fracture with routine healing: Secondary | ICD-10-CM | POA: Diagnosis not present

## 2019-10-15 DIAGNOSIS — F900 Attention-deficit hyperactivity disorder, predominantly inattentive type: Secondary | ICD-10-CM | POA: Diagnosis not present

## 2019-10-15 DIAGNOSIS — F331 Major depressive disorder, recurrent, moderate: Secondary | ICD-10-CM | POA: Diagnosis not present

## 2019-10-15 DIAGNOSIS — E782 Mixed hyperlipidemia: Secondary | ICD-10-CM | POA: Diagnosis not present

## 2019-10-15 DIAGNOSIS — R4582 Worries: Secondary | ICD-10-CM | POA: Diagnosis not present

## 2019-10-15 DIAGNOSIS — G43009 Migraine without aura, not intractable, without status migrainosus: Secondary | ICD-10-CM | POA: Diagnosis not present

## 2019-10-15 DIAGNOSIS — R0602 Shortness of breath: Secondary | ICD-10-CM | POA: Diagnosis not present

## 2019-10-15 DIAGNOSIS — G35 Multiple sclerosis: Secondary | ICD-10-CM | POA: Diagnosis not present

## 2019-10-29 DIAGNOSIS — N183 Chronic kidney disease, stage 3 unspecified: Secondary | ICD-10-CM | POA: Diagnosis not present

## 2019-10-29 DIAGNOSIS — E7849 Other hyperlipidemia: Secondary | ICD-10-CM | POA: Diagnosis not present

## 2019-10-29 DIAGNOSIS — M797 Fibromyalgia: Secondary | ICD-10-CM | POA: Diagnosis not present

## 2019-10-30 DIAGNOSIS — M25561 Pain in right knee: Secondary | ICD-10-CM | POA: Diagnosis not present

## 2019-10-30 DIAGNOSIS — S82142D Displaced bicondylar fracture of left tibia, subsequent encounter for closed fracture with routine healing: Secondary | ICD-10-CM | POA: Diagnosis not present

## 2019-11-28 DIAGNOSIS — N183 Chronic kidney disease, stage 3 unspecified: Secondary | ICD-10-CM | POA: Diagnosis not present

## 2019-11-28 DIAGNOSIS — E7849 Other hyperlipidemia: Secondary | ICD-10-CM | POA: Diagnosis not present

## 2019-11-28 DIAGNOSIS — F900 Attention-deficit hyperactivity disorder, predominantly inattentive type: Secondary | ICD-10-CM | POA: Diagnosis not present

## 2019-11-28 DIAGNOSIS — M797 Fibromyalgia: Secondary | ICD-10-CM | POA: Diagnosis not present

## 2019-12-25 DIAGNOSIS — S82142D Displaced bicondylar fracture of left tibia, subsequent encounter for closed fracture with routine healing: Secondary | ICD-10-CM | POA: Diagnosis not present

## 2019-12-28 DIAGNOSIS — M797 Fibromyalgia: Secondary | ICD-10-CM | POA: Diagnosis not present

## 2019-12-28 DIAGNOSIS — N183 Chronic kidney disease, stage 3 unspecified: Secondary | ICD-10-CM | POA: Diagnosis not present

## 2019-12-28 DIAGNOSIS — F900 Attention-deficit hyperactivity disorder, predominantly inattentive type: Secondary | ICD-10-CM | POA: Diagnosis not present

## 2019-12-28 DIAGNOSIS — E7849 Other hyperlipidemia: Secondary | ICD-10-CM | POA: Diagnosis not present

## 2020-01-20 DIAGNOSIS — F331 Major depressive disorder, recurrent, moderate: Secondary | ICD-10-CM | POA: Diagnosis not present

## 2020-01-20 DIAGNOSIS — N1831 Chronic kidney disease, stage 3a: Secondary | ICD-10-CM | POA: Diagnosis not present

## 2020-01-20 DIAGNOSIS — F900 Attention-deficit hyperactivity disorder, predominantly inattentive type: Secondary | ICD-10-CM | POA: Diagnosis not present

## 2020-01-20 DIAGNOSIS — G43009 Migraine without aura, not intractable, without status migrainosus: Secondary | ICD-10-CM | POA: Diagnosis not present

## 2020-01-20 DIAGNOSIS — R4582 Worries: Secondary | ICD-10-CM | POA: Diagnosis not present

## 2020-01-20 DIAGNOSIS — I7300 Raynaud's syndrome without gangrene: Secondary | ICD-10-CM | POA: Diagnosis not present

## 2020-01-20 DIAGNOSIS — E782 Mixed hyperlipidemia: Secondary | ICD-10-CM | POA: Diagnosis not present

## 2020-01-28 DIAGNOSIS — N183 Chronic kidney disease, stage 3 unspecified: Secondary | ICD-10-CM | POA: Diagnosis not present

## 2020-01-28 DIAGNOSIS — E7849 Other hyperlipidemia: Secondary | ICD-10-CM | POA: Diagnosis not present

## 2020-01-28 DIAGNOSIS — M797 Fibromyalgia: Secondary | ICD-10-CM | POA: Diagnosis not present

## 2020-02-12 DIAGNOSIS — Z961 Presence of intraocular lens: Secondary | ICD-10-CM | POA: Diagnosis not present

## 2020-02-12 DIAGNOSIS — H26491 Other secondary cataract, right eye: Secondary | ICD-10-CM | POA: Diagnosis not present

## 2020-02-28 DIAGNOSIS — F900 Attention-deficit hyperactivity disorder, predominantly inattentive type: Secondary | ICD-10-CM | POA: Diagnosis not present

## 2020-02-28 DIAGNOSIS — N183 Chronic kidney disease, stage 3 unspecified: Secondary | ICD-10-CM | POA: Diagnosis not present

## 2020-02-28 DIAGNOSIS — E7849 Other hyperlipidemia: Secondary | ICD-10-CM | POA: Diagnosis not present

## 2020-02-28 DIAGNOSIS — M797 Fibromyalgia: Secondary | ICD-10-CM | POA: Diagnosis not present

## 2020-03-28 DIAGNOSIS — M797 Fibromyalgia: Secondary | ICD-10-CM | POA: Diagnosis not present

## 2020-03-28 DIAGNOSIS — N183 Chronic kidney disease, stage 3 unspecified: Secondary | ICD-10-CM | POA: Diagnosis not present

## 2020-03-28 DIAGNOSIS — F900 Attention-deficit hyperactivity disorder, predominantly inattentive type: Secondary | ICD-10-CM | POA: Diagnosis not present

## 2020-03-28 DIAGNOSIS — K219 Gastro-esophageal reflux disease without esophagitis: Secondary | ICD-10-CM | POA: Diagnosis not present

## 2020-03-28 DIAGNOSIS — E7849 Other hyperlipidemia: Secondary | ICD-10-CM | POA: Diagnosis not present

## 2020-04-13 DIAGNOSIS — S82142D Displaced bicondylar fracture of left tibia, subsequent encounter for closed fracture with routine healing: Secondary | ICD-10-CM | POA: Diagnosis not present

## 2020-04-13 DIAGNOSIS — M25562 Pain in left knee: Secondary | ICD-10-CM | POA: Diagnosis not present

## 2020-04-21 DIAGNOSIS — R300 Dysuria: Secondary | ICD-10-CM | POA: Diagnosis not present

## 2020-04-21 DIAGNOSIS — Z6830 Body mass index (BMI) 30.0-30.9, adult: Secondary | ICD-10-CM | POA: Diagnosis not present

## 2020-04-21 DIAGNOSIS — Z9189 Other specified personal risk factors, not elsewhere classified: Secondary | ICD-10-CM | POA: Diagnosis not present

## 2020-04-21 DIAGNOSIS — G35 Multiple sclerosis: Secondary | ICD-10-CM | POA: Diagnosis not present

## 2020-04-21 DIAGNOSIS — N1831 Chronic kidney disease, stage 3a: Secondary | ICD-10-CM | POA: Diagnosis not present

## 2020-04-21 DIAGNOSIS — F900 Attention-deficit hyperactivity disorder, predominantly inattentive type: Secondary | ICD-10-CM | POA: Diagnosis not present

## 2020-04-21 DIAGNOSIS — Z1212 Encounter for screening for malignant neoplasm of rectum: Secondary | ICD-10-CM | POA: Diagnosis not present

## 2020-04-21 DIAGNOSIS — R5383 Other fatigue: Secondary | ICD-10-CM | POA: Diagnosis not present

## 2020-04-21 DIAGNOSIS — E782 Mixed hyperlipidemia: Secondary | ICD-10-CM | POA: Diagnosis not present

## 2020-04-21 DIAGNOSIS — M797 Fibromyalgia: Secondary | ICD-10-CM | POA: Diagnosis not present

## 2020-04-21 DIAGNOSIS — R4582 Worries: Secondary | ICD-10-CM | POA: Diagnosis not present

## 2020-04-21 DIAGNOSIS — F331 Major depressive disorder, recurrent, moderate: Secondary | ICD-10-CM | POA: Diagnosis not present

## 2020-04-21 DIAGNOSIS — G43009 Migraine without aura, not intractable, without status migrainosus: Secondary | ICD-10-CM | POA: Diagnosis not present

## 2020-04-21 DIAGNOSIS — I7300 Raynaud's syndrome without gangrene: Secondary | ICD-10-CM | POA: Diagnosis not present

## 2020-04-27 DIAGNOSIS — N183 Chronic kidney disease, stage 3 unspecified: Secondary | ICD-10-CM | POA: Diagnosis not present

## 2020-04-27 DIAGNOSIS — K219 Gastro-esophageal reflux disease without esophagitis: Secondary | ICD-10-CM | POA: Diagnosis not present

## 2020-04-27 DIAGNOSIS — F900 Attention-deficit hyperactivity disorder, predominantly inattentive type: Secondary | ICD-10-CM | POA: Diagnosis not present

## 2020-04-27 DIAGNOSIS — E7849 Other hyperlipidemia: Secondary | ICD-10-CM | POA: Diagnosis not present

## 2020-04-27 DIAGNOSIS — M797 Fibromyalgia: Secondary | ICD-10-CM | POA: Diagnosis not present

## 2020-05-06 DIAGNOSIS — B029 Zoster without complications: Secondary | ICD-10-CM | POA: Diagnosis not present

## 2020-05-06 DIAGNOSIS — L03116 Cellulitis of left lower limb: Secondary | ICD-10-CM | POA: Diagnosis not present

## 2020-05-06 DIAGNOSIS — Z6831 Body mass index (BMI) 31.0-31.9, adult: Secondary | ICD-10-CM | POA: Diagnosis not present

## 2020-05-18 DIAGNOSIS — S82142D Displaced bicondylar fracture of left tibia, subsequent encounter for closed fracture with routine healing: Secondary | ICD-10-CM | POA: Diagnosis not present

## 2020-05-22 DIAGNOSIS — M79605 Pain in left leg: Secondary | ICD-10-CM | POA: Diagnosis not present

## 2020-05-27 DIAGNOSIS — E7849 Other hyperlipidemia: Secondary | ICD-10-CM | POA: Diagnosis not present

## 2020-05-27 DIAGNOSIS — M797 Fibromyalgia: Secondary | ICD-10-CM | POA: Diagnosis not present

## 2020-05-27 DIAGNOSIS — F900 Attention-deficit hyperactivity disorder, predominantly inattentive type: Secondary | ICD-10-CM | POA: Diagnosis not present

## 2020-05-27 DIAGNOSIS — N183 Chronic kidney disease, stage 3 unspecified: Secondary | ICD-10-CM | POA: Diagnosis not present

## 2020-05-27 DIAGNOSIS — K219 Gastro-esophageal reflux disease without esophagitis: Secondary | ICD-10-CM | POA: Diagnosis not present

## 2020-06-27 DIAGNOSIS — N183 Chronic kidney disease, stage 3 unspecified: Secondary | ICD-10-CM | POA: Diagnosis not present

## 2020-06-27 DIAGNOSIS — F900 Attention-deficit hyperactivity disorder, predominantly inattentive type: Secondary | ICD-10-CM | POA: Diagnosis not present

## 2020-06-27 DIAGNOSIS — K219 Gastro-esophageal reflux disease without esophagitis: Secondary | ICD-10-CM | POA: Diagnosis not present

## 2020-06-27 DIAGNOSIS — M797 Fibromyalgia: Secondary | ICD-10-CM | POA: Diagnosis not present

## 2020-06-27 DIAGNOSIS — E7849 Other hyperlipidemia: Secondary | ICD-10-CM | POA: Diagnosis not present

## 2020-07-17 DIAGNOSIS — M9902 Segmental and somatic dysfunction of thoracic region: Secondary | ICD-10-CM | POA: Diagnosis not present

## 2020-07-17 DIAGNOSIS — N1831 Chronic kidney disease, stage 3a: Secondary | ICD-10-CM | POA: Diagnosis not present

## 2020-07-17 DIAGNOSIS — M5441 Lumbago with sciatica, right side: Secondary | ICD-10-CM | POA: Diagnosis not present

## 2020-07-17 DIAGNOSIS — R5383 Other fatigue: Secondary | ICD-10-CM | POA: Diagnosis not present

## 2020-07-17 DIAGNOSIS — E782 Mixed hyperlipidemia: Secondary | ICD-10-CM | POA: Diagnosis not present

## 2020-07-17 DIAGNOSIS — R5382 Chronic fatigue, unspecified: Secondary | ICD-10-CM | POA: Diagnosis not present

## 2020-07-17 DIAGNOSIS — D518 Other vitamin B12 deficiency anemias: Secondary | ICD-10-CM | POA: Diagnosis not present

## 2020-07-17 DIAGNOSIS — M9903 Segmental and somatic dysfunction of lumbar region: Secondary | ICD-10-CM | POA: Diagnosis not present

## 2020-07-17 DIAGNOSIS — M9905 Segmental and somatic dysfunction of pelvic region: Secondary | ICD-10-CM | POA: Diagnosis not present

## 2020-07-17 DIAGNOSIS — E876 Hypokalemia: Secondary | ICD-10-CM | POA: Diagnosis not present

## 2020-07-20 DIAGNOSIS — N1831 Chronic kidney disease, stage 3a: Secondary | ICD-10-CM | POA: Diagnosis not present

## 2020-07-20 DIAGNOSIS — Z0001 Encounter for general adult medical examination with abnormal findings: Secondary | ICD-10-CM | POA: Diagnosis not present

## 2020-07-20 DIAGNOSIS — I7300 Raynaud's syndrome without gangrene: Secondary | ICD-10-CM | POA: Diagnosis not present

## 2020-07-20 DIAGNOSIS — E7849 Other hyperlipidemia: Secondary | ICD-10-CM | POA: Diagnosis not present

## 2020-07-20 DIAGNOSIS — F900 Attention-deficit hyperactivity disorder, predominantly inattentive type: Secondary | ICD-10-CM | POA: Diagnosis not present

## 2020-07-20 DIAGNOSIS — G43009 Migraine without aura, not intractable, without status migrainosus: Secondary | ICD-10-CM | POA: Diagnosis not present

## 2020-07-20 DIAGNOSIS — R4582 Worries: Secondary | ICD-10-CM | POA: Diagnosis not present

## 2020-07-20 DIAGNOSIS — R300 Dysuria: Secondary | ICD-10-CM | POA: Diagnosis not present

## 2020-07-27 DIAGNOSIS — K219 Gastro-esophageal reflux disease without esophagitis: Secondary | ICD-10-CM | POA: Diagnosis not present

## 2020-07-27 DIAGNOSIS — M797 Fibromyalgia: Secondary | ICD-10-CM | POA: Diagnosis not present

## 2020-07-27 DIAGNOSIS — E7849 Other hyperlipidemia: Secondary | ICD-10-CM | POA: Diagnosis not present

## 2020-07-27 DIAGNOSIS — N183 Chronic kidney disease, stage 3 unspecified: Secondary | ICD-10-CM | POA: Diagnosis not present

## 2020-07-27 DIAGNOSIS — F900 Attention-deficit hyperactivity disorder, predominantly inattentive type: Secondary | ICD-10-CM | POA: Diagnosis not present

## 2020-07-28 DIAGNOSIS — M9905 Segmental and somatic dysfunction of pelvic region: Secondary | ICD-10-CM | POA: Diagnosis not present

## 2020-07-28 DIAGNOSIS — M9902 Segmental and somatic dysfunction of thoracic region: Secondary | ICD-10-CM | POA: Diagnosis not present

## 2020-07-28 DIAGNOSIS — M5441 Lumbago with sciatica, right side: Secondary | ICD-10-CM | POA: Diagnosis not present

## 2020-07-28 DIAGNOSIS — M9903 Segmental and somatic dysfunction of lumbar region: Secondary | ICD-10-CM | POA: Diagnosis not present

## 2020-08-07 ENCOUNTER — Ambulatory Visit: Payer: PPO | Admitting: Allergy & Immunology

## 2020-10-05 DIAGNOSIS — M9903 Segmental and somatic dysfunction of lumbar region: Secondary | ICD-10-CM | POA: Diagnosis not present

## 2020-10-05 DIAGNOSIS — M5441 Lumbago with sciatica, right side: Secondary | ICD-10-CM | POA: Diagnosis not present

## 2020-10-05 DIAGNOSIS — M9902 Segmental and somatic dysfunction of thoracic region: Secondary | ICD-10-CM | POA: Diagnosis not present

## 2020-10-05 DIAGNOSIS — M9905 Segmental and somatic dysfunction of pelvic region: Secondary | ICD-10-CM | POA: Diagnosis not present

## 2020-10-14 DIAGNOSIS — M9902 Segmental and somatic dysfunction of thoracic region: Secondary | ICD-10-CM | POA: Diagnosis not present

## 2020-10-14 DIAGNOSIS — M5441 Lumbago with sciatica, right side: Secondary | ICD-10-CM | POA: Diagnosis not present

## 2020-10-14 DIAGNOSIS — M9905 Segmental and somatic dysfunction of pelvic region: Secondary | ICD-10-CM | POA: Diagnosis not present

## 2020-10-14 DIAGNOSIS — M9903 Segmental and somatic dysfunction of lumbar region: Secondary | ICD-10-CM | POA: Diagnosis not present

## 2020-10-18 ENCOUNTER — Encounter: Payer: Self-pay | Admitting: Gastroenterology

## 2020-10-28 ENCOUNTER — Ambulatory Visit: Payer: PPO | Admitting: Allergy & Immunology

## 2020-10-28 DIAGNOSIS — N1831 Chronic kidney disease, stage 3a: Secondary | ICD-10-CM | POA: Diagnosis not present

## 2020-10-28 DIAGNOSIS — R4582 Worries: Secondary | ICD-10-CM | POA: Diagnosis not present

## 2020-10-28 DIAGNOSIS — I7300 Raynaud's syndrome without gangrene: Secondary | ICD-10-CM | POA: Diagnosis not present

## 2020-10-28 DIAGNOSIS — E7849 Other hyperlipidemia: Secondary | ICD-10-CM | POA: Diagnosis not present

## 2020-10-28 DIAGNOSIS — G43009 Migraine without aura, not intractable, without status migrainosus: Secondary | ICD-10-CM | POA: Diagnosis not present

## 2020-10-28 DIAGNOSIS — F900 Attention-deficit hyperactivity disorder, predominantly inattentive type: Secondary | ICD-10-CM | POA: Diagnosis not present

## 2020-10-28 DIAGNOSIS — G35 Multiple sclerosis: Secondary | ICD-10-CM | POA: Diagnosis not present

## 2020-10-28 DIAGNOSIS — R300 Dysuria: Secondary | ICD-10-CM | POA: Diagnosis not present

## 2020-11-27 DIAGNOSIS — E7849 Other hyperlipidemia: Secondary | ICD-10-CM | POA: Diagnosis not present

## 2020-11-27 DIAGNOSIS — M797 Fibromyalgia: Secondary | ICD-10-CM | POA: Diagnosis not present

## 2020-11-27 DIAGNOSIS — F900 Attention-deficit hyperactivity disorder, predominantly inattentive type: Secondary | ICD-10-CM | POA: Diagnosis not present

## 2020-11-27 DIAGNOSIS — K219 Gastro-esophageal reflux disease without esophagitis: Secondary | ICD-10-CM | POA: Diagnosis not present

## 2020-11-27 DIAGNOSIS — N183 Chronic kidney disease, stage 3 unspecified: Secondary | ICD-10-CM | POA: Diagnosis not present

## 2020-12-16 ENCOUNTER — Ambulatory Visit: Payer: PPO | Admitting: Allergy & Immunology

## 2021-01-26 DIAGNOSIS — F900 Attention-deficit hyperactivity disorder, predominantly inattentive type: Secondary | ICD-10-CM | POA: Diagnosis not present

## 2021-01-26 DIAGNOSIS — G43009 Migraine without aura, not intractable, without status migrainosus: Secondary | ICD-10-CM | POA: Diagnosis not present

## 2021-01-26 DIAGNOSIS — E7849 Other hyperlipidemia: Secondary | ICD-10-CM | POA: Diagnosis not present

## 2021-01-26 DIAGNOSIS — I7300 Raynaud's syndrome without gangrene: Secondary | ICD-10-CM | POA: Diagnosis not present

## 2021-01-26 DIAGNOSIS — N1831 Chronic kidney disease, stage 3a: Secondary | ICD-10-CM | POA: Diagnosis not present

## 2021-01-26 DIAGNOSIS — G35 Multiple sclerosis: Secondary | ICD-10-CM | POA: Diagnosis not present

## 2021-01-26 DIAGNOSIS — R5383 Other fatigue: Secondary | ICD-10-CM | POA: Diagnosis not present

## 2021-01-26 DIAGNOSIS — R4582 Worries: Secondary | ICD-10-CM | POA: Diagnosis not present

## 2021-02-12 ENCOUNTER — Other Ambulatory Visit: Payer: Self-pay

## 2021-02-12 ENCOUNTER — Encounter: Payer: Self-pay | Admitting: Allergy & Immunology

## 2021-02-12 ENCOUNTER — Ambulatory Visit (INDEPENDENT_AMBULATORY_CARE_PROVIDER_SITE_OTHER): Payer: PPO | Admitting: Allergy & Immunology

## 2021-02-12 VITALS — BP 148/100 | HR 67 | Temp 98.3°F | Resp 18 | Ht 64.0 in | Wt 161.4 lb

## 2021-02-12 DIAGNOSIS — J310 Chronic rhinitis: Secondary | ICD-10-CM

## 2021-02-12 DIAGNOSIS — B999 Unspecified infectious disease: Secondary | ICD-10-CM | POA: Diagnosis not present

## 2021-02-12 DIAGNOSIS — E876 Hypokalemia: Secondary | ICD-10-CM | POA: Diagnosis not present

## 2021-02-12 DIAGNOSIS — N183 Chronic kidney disease, stage 3 unspecified: Secondary | ICD-10-CM | POA: Diagnosis not present

## 2021-02-12 DIAGNOSIS — E782 Mixed hyperlipidemia: Secondary | ICD-10-CM | POA: Diagnosis not present

## 2021-02-12 DIAGNOSIS — R5382 Chronic fatigue, unspecified: Secondary | ICD-10-CM | POA: Diagnosis not present

## 2021-02-12 NOTE — Patient Instructions (Addendum)
1. Recurrent infections - We are going to evaluate your immune system with some blood work.  - We are doing this since you have a history of the recurrent shingles.  - We will call you in 1-2 weeks with the results of the testing.   2. Chronic rhinitis - We are going to get some lab work to look for environmental allergies.  - Continue with the nasal saline. - Continue with the antihistamine as needed.  3. Return if symptoms worsen or fail to improve.    Please inform us of any Emergency Department visits, hospitalizations, or changes in symptoms. Call us before going to the ED for breathing or allergy symptoms since we might be able to fit you in for a sick visit. Feel free to contact us anytime with any questions, problems, or concerns.  It was a pleasure to meet you today!  Websites that have reliable patient information: 1. American Academy of Asthma, Allergy, and Immunology: www.aaaai.org 2. Food Allergy Research and Education (FARE): foodallergy.org 3. Mothers of Asthmatics: http://www.asthmacommunitynetwork.org 4. American College of Allergy, Asthma, and Immunology: www.acaai.org   COVID-19 Vaccine Information can be found at: ShippingScam.co.uk For questions related to vaccine distribution or appointments, please email [email or web address] or call [phone].   We realize that you might be concerned about having an allergic reaction to the COVID19 vaccines. To help with that concern, WE ARE OFFERING THE COVID19 VACCINES IN OUR OFFICE! Ask the front desk for dates!     Like [place] on [employer] and Instagram for our latest updates!      A healthy democracy works best when [employer] participate! Make sure you are registered to vote! If you have moved or changed any of your contact information, you will need to get this updated before voting!  In some cases, you MAY be able to register to vote online:  [email or web address]

## 2021-02-12 NOTE — Progress Notes (Signed)
NEW PATIENT  Date of Service/Encounter:  02/12/21  Consult requested by: Caryl Bis, MD   Assessment:   Chronic rhinitis  Recurrent infections   Preference for avoidance of vaccinations   Ms. Nancy Byrd presents for an evaluation of recurrent infections as well as environmental allergies.  With her history of recurrent infections, I am going to just get blood work to evaluate her immune system.  Because of her anxiety associated with allergy testing, I think we will just go ahead and look for environmental allergens via the blood since we are getting blood anyway.  She seemed very relieved about this, as she had worked herself up over the skin testing possibility.  We will touch base after the labs and make future decisions after that.  Plan/Recommendations:    1. Recurrent infections - We are going to evaluate your immune system with some blood work.  - We are doing this since you have a history of the recurrent shingles.  - We will call you in 1-2 weeks with the results of the testing.   2. Chronic rhinitis - We are going to get some lab work to look for environmental allergies.  - Continue with the nasal saline. - Continue with the antihistamine as needed.  3. Return if symptoms worsen or fail to improve.     This note in its entirety was forwarded to the Provider who requested this consultation.  Subjective:   Nancy Byrd is a 55 y.o. female presenting today for evaluation of  Chief Complaint  Patient presents with   Allergy Testing    Nancy Byrd has a history of the following: Patient Active Problem List   Diagnosis Date Noted   Lipoma of left lower extremity 05/10/2018   Essential tremor 10/17/2016   GERD (gastroesophageal reflux disease) 10/17/2016   Leg pain, left 10/20/2015   Spinal stenosis in cervical region 10/15/2015   Gait disturbance 02/18/2015   High risk medication use 02/18/2015   Urinary incontinence 02/18/2015   Depression with  anxiety 02/18/2015   Disturbed cognition 02/18/2015   Other fatigue 02/18/2015   Hallux rigidus of left foot 05/08/2014   Multiple sclerosis ([place]) 05/08/2014   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  Nancy Byrd was referred by Caryl Bis, MD.     Nancy Byrd is a 55 y.o. female presenting for an evaluation of allergic reactions . She grew up in [place] and in fact she just moved into her childhood home. She has chickens and guineas. She had 60 chickens at one point but now she is down to 20.   She has had shingles several times. She had her first episode of shingles within the last five years. She went to see him and thought that she was broken out. It was on her knee. She also has had a sore on the inside of her nose. She has had these lesions in her nose. She will take Valtrex and it goes away. She takes this for 3-5 days and it resolves. She has not had a shingles vaccine and is a vaccine refuser.   These are at various parts of her body. She does not have issues with other infections from what I can gather. She had an injury to her nose in 6th grade. Congestion has been going on since that time.   She was  diagnosed with MS in 2009. She has chronic kidney disease that was diagnosed this past year. This from the arthritis medicine from the Mobic and the Motrin. She has a history of juvenile RA. She was on Enbrel and others at some point. She had anaphylaxis to several injections.   Allergic Rhinitis Symptom History: She has environmental allergies that are worse in the winter. She denies sneezing. She reports that she has bleeding from her nose. She does not use a nose spray aside from saline. She has no sneezing but she has had watery eyes due to cataract surgery.  She has never had allergy testing in the recent past, but she did have testing when she was very  young. She does not recall what she was positive to when she was very young. She thinks maybe dust. She cannot recall.   Food Allergy Symptom History: She reports a seafood allergy. When she was a teenager, she ate some shellfish and she vomited 21 times in 90 minutes and she was hospitalized.  She has an EpiPen and it is up to date.   Her mother had cardiac issues. She had a stent placed. Otherwise her family history is unremarkable.   Otherwise, there is no history of other atopic diseases, including asthma, food allergies, drug allergies, stinging insect allergies, eczema, urticaria, or contact dermatitis. There is no significant infectious history. Vaccinations are up to date.    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 If you have any questions, ask your nurse or doctor.           STOP taking these medications    meclizine 25 MG tablet Commonly known as: ANTIVERT Stopped by: Valentina Shaggy, MD       TAKE these medications    acetaminophen 500 MG tablet Commonly known as: TYLENOL Take 1,000 mg by mouth every 8 (eight) hours as needed.   ALIGN PO Take by mouth.   ALPRAZolam 0.5 MG tablet Commonly known as: XANAX Take 0.5 mg by mouth at bedtime as needed for anxiety.   amphetamine-dextroamphetamine 30 MG 24 hr capsule Commonly known as: ADDERALL XR Take 30 mg by mouth daily.   Armodafinil 250 MG tablet Take 1 tablet (250 mg total) by mouth daily.   aspirin EC 81 MG tablet Take  81 mg by mouth daily.   Biotin 1000 MCG Chew biotin 1,000 mcg chewable tablet  Take by oral route.   busPIRone 10 MG tablet Commonly known as: BUSPAR   CALCIUM + D PO Take 1,200 mg by mouth daily.   cetirizine 10 MG tablet Commonly known as: ZYRTEC Take 10 mg by mouth daily.   desonide 0.05 % lotion Commonly known as: DESOWEN desonide 0.05 % lotion  APPLY SPARINGLY AND RUB GENTLY INTO THE AFFECTED AREA(S) BY TOPICAL ROUTE 2 TIMES PER DAY   Dextroamphetamine Sulfate 30 MG Tabs dextroamphetamine sulfate 30 mg tablet  Take 1 tablet every day by oral route.   DULoxetine 60 MG capsule Commonly known as: Cymbalta Take 2 capsules (120 mg total) by mouth daily.   EPINEPHrine 0.3 mg/0.3 mL Soaj injection Commonly known as: EPI-PEN Inject 0.3 mg into the muscle once.   lactase 3000 units tablet Commonly known as: LACTAID Take by mouth 3 (three) times daily with meals.   magnesium oxide 400 (240 Mg) MG tablet Commonly known as: MAG-OX Take 1 tablet by mouth 2 (two) times daily.   Myrbetriq 50 MG Tb24 tablet Generic drug: mirabegron ER TAKE ONE (1) TABLET BY MOUTH EVERY DAY   ofloxacin 0.3 % ophthalmic solution Commonly known as: OCUFLOX ofloxacin 0.3 % eye drops   Omega-3 1000 MG Caps Take by mouth.   ondansetron 4 MG tablet Commonly known  as: ZOFRAN Take 1 mg by mouth every 8 (eight) hours as needed for nausea or vomiting.   primidone 50 MG tablet Commonly known as: MYSOLINE Take 1 tablet (50 mg total) by mouth at bedtime.   propranolol ER 80 MG 24 hr capsule Commonly known as: Inderal LA Take 1 capsule (80 mg total) by mouth daily.   RABEprazole 20 MG tablet Commonly known as: ACIPHEX Take 1 tablet (20 mg total) by mouth daily.   rOPINIRole 0.25 MG tablet Commonly known as: REQUIP Take 1 tablet (0.25 mg total) by mouth as needed.   scopolamine 1 MG/3DAYS Commonly known as: TRANSDERM-SCOP Place 1 patch onto the skin every 3 (three) days.   SOYA LECITHIN PO Take 1,200 mg by mouth daily.   topiramate 200 MG tablet Commonly known as: TOPAMAX Take 200 mg by mouth 2 (two) times daily.   torsemide 20 MG tablet Commonly known as: DEMADEX Take 20 mg by mouth 2 (two) times daily.   UBIQUINOL PO Take by mouth.   cyanocobalamin 1000 MCG/ML injection Commonly known as: (VITAMIN B-12) SMARTSIG:2 Milliliter(s) SUB-Q Once a Month   VITAMIN B-12 IJ Inject as directed every 30 (thirty) days.   vitamin C 1000 MG tablet Take 1,000 mg by mouth daily.   Vitamin E 180 MG (400 UNIT) Caps Take 1 capsule by mouth daily.   Zinc 50 MG Caps zinc 50 mg capsule  Take 1 capsule every day by oral route.   zolmitriptan 5 MG tablet Commonly known as: ZOMIG Take 5 mg by mouth as needed for migraine.        Birth History: non-contributory  Developmental History: non-contributory  Past Surgical History: Past Surgical History:  Procedure Laterality Date   ADENOIDECTOMY     APPENDECTOMY  06/07/2004   CARDIAC CATHETERIZATION  06/13/2006   CHOLECYSTECTOMY  03/01/1987   NASAL FRACTURE SURGERY     as a child   TENOSYNOVECTOMY Bilateral 03/01/1983   TONSILECTOMY, ADENOIDECTOMY, BILATERAL MYRINGOTOMY AND TUBES     as a child   TONSILLECTOMY     TOTAL ABDOMINAL HYSTERECTOMY  06/07/2004     Family History: Family  History  Problem Relation Age of Onset   Asthma Maternal Grandmother    Colon cancer Neg Hx    Colon polyps Neg Hx      Social History: Geralene lives at home with her significant other.  She has a house that is 56 years old.  There is wood in the main living areas and hardwoods in the bedrooms.  They have gas heating and central cooling.  There is a cat inside of the home.  There are chickens and again he fell outside of the home.  There are dust mite covers on the bedding.  There is no tobacco exposure at all. There is no dust exposure in the home. There are HEPA filters in the home.    Review of Systems  Constitutional:  Positive for malaise/fatigue. Negative for chills, fever and weight loss.  HENT:  Positive for congestion and sinus pain. Negative for ear discharge, ear pain and sore throat.   Eyes:  Negative for pain, discharge and redness.  Respiratory:  Negative for cough, sputum production, shortness of breath and wheezing.   Cardiovascular: Negative.  Negative for chest pain and palpitations.  Gastrointestinal:  Negative for abdominal pain, constipation, diarrhea, heartburn, nausea and vomiting.  Skin: Negative.  Negative for itching and rash.  Neurological:  Negative for dizziness and headaches.  Endo/Heme/Allergies:  Positive for environmental allergies. Does not bruise/bleed easily.      Objective:   Blood pressure (!) 148/100, pulse 67, temperature 98.3 F (36.8 C), temperature source Temporal, resp. rate 18, height 5\' 4"  (1.626 m), weight 161 lb 6.4 oz (73.2 kg), SpO2 100 %. Body mass index is 27.7 kg/m.   Physical Exam:   Physical Exam Vitals reviewed.  Constitutional:      Appearance: She is well-developed.     Comments: Anxious.   HENT:     Head: Normocephalic and atraumatic.     Right Ear: Tympanic membrane, ear canal and external ear normal. No drainage, swelling or tenderness. Tympanic membrane is not injected, scarred, erythematous, retracted or bulging.      Left Ear: Tympanic membrane, ear canal and external ear normal. No drainage, swelling or tenderness. Tympanic membrane is not injected, scarred, erythematous, retracted or bulging.     Nose: No nasal deformity, septal deviation, mucosal edema or rhinorrhea.     Right Turbinates: Enlarged and swollen.     Left Turbinates: Enlarged and swollen.     Right Sinus: No maxillary sinus tenderness or frontal sinus tenderness.     Left Sinus: No maxillary sinus tenderness or frontal sinus tenderness.     Mouth/Throat:     Mouth: Mucous membranes are not pale and not dry.     Pharynx: Uvula midline.  Eyes:     General:        Right eye: No discharge.        Left eye: No discharge.     Conjunctiva/sclera: Conjunctivae normal.     Right eye: Right conjunctiva is not injected. No chemosis.    Left eye: Left conjunctiva is not injected. No chemosis.    Pupils: Pupils are equal, round, and reactive to light.  Cardiovascular:     Rate and Rhythm: Normal rate and regular rhythm.     Heart sounds: Normal heart sounds.  Pulmonary:     Effort: Pulmonary effort is normal. No tachypnea, accessory muscle usage or respiratory distress.     Breath sounds: Normal breath sounds. No  wheezing, rhonchi or rales.     Comments: Moving air well in all lung fields. No increased work of breathing noted.  Chest:     Chest wall: No tenderness.  Abdominal:     Tenderness: There is no abdominal tenderness. There is no guarding or rebound.  Lymphadenopathy:     Head:     Right side of head: No submandibular, tonsillar or occipital adenopathy.     Left side of head: No submandibular, tonsillar or occipital adenopathy.     Cervical: No cervical adenopathy.  Skin:    General: Skin is warm.     Capillary Refill: Capillary refill takes less than 2 seconds.     Coloration: Skin is not pale.     Findings: No abrasion, erythema, petechiae or rash. Rash is not papular, urticarial or vesicular.  Neurological:     Mental  Status: She is alert.  Psychiatric:        Behavior: Behavior is cooperative.     Diagnostic studies: labs sent instead      Salvatore Marvel, MD [facility]

## 2021-02-16 ENCOUNTER — Encounter: Payer: Self-pay | Admitting: Allergy & Immunology

## 2021-02-18 LAB — LYMPH ENUMERATION, BASIC & NK CELLS
% CD 3 Pos. Lymph.: 77.8 % (ref 57.5–86.2)
% CD 4 Pos. Lymph.: 60.2 % — ABNORMAL HIGH (ref 30.8–58.5)
% NK (CD56/16): 11 % (ref 1.4–19.4)
Ab NK (CD56/16): 187 /uL (ref 24–406)
Absolute CD 3: 1323 /uL (ref 622–2402)
Absolute CD 4 Helper: 1023 /uL (ref 359–1519)
Basophils Absolute: 0.1 10*3/uL (ref 0.0–0.2)
Basos: 1 %
CD19 % B Cell: 9.6 % (ref 3.3–25.4)
CD19 Abs: 163 /uL (ref 12–645)
CD4/CD8 Ratio: 3.36 (ref 0.92–3.72)
CD8 % Suppressor T Cell: 17.9 % (ref 12.0–35.5)
CD8 T Cell Abs: 304 /uL (ref 109–897)
EOS (ABSOLUTE): 0.2 10*3/uL (ref 0.0–0.4)
Eos: 4 %
Hematocrit: 40 % (ref 34.0–46.6)
Hemoglobin: 12.8 g/dL (ref 11.1–15.9)
Immature Grans (Abs): 0 10*3/uL (ref 0.0–0.1)
Immature Granulocytes: 0 %
Lymphocytes Absolute: 1.7 10*3/uL (ref 0.7–3.1)
Lymphs: 34 %
MCH: 30.3 pg (ref 26.6–33.0)
MCHC: 32 g/dL (ref 31.5–35.7)
MCV: 95 fL (ref 79–97)
Monocytes Absolute: 0.4 10*3/uL (ref 0.1–0.9)
Monocytes: 9 %
Neutrophils Absolute: 2.6 10*3/uL (ref 1.4–7.0)
Neutrophils: 52 %
Platelets: 270 10*3/uL (ref 150–450)
RBC: 4.23 x10E6/uL (ref 3.77–5.28)
RDW: 12.9 % (ref 11.7–15.4)
WBC: 5 10*3/uL (ref 3.4–10.8)

## 2021-02-18 LAB — ALLERGENS W/COMP RFLX AREA 2
Alternaria Alternata IgE: <0.1 kU/L
Aspergillus Fumigatus IgE: <0.1 kU/L
Bermuda Grass IgE: <0.1 kU/L
Cedar, Mountain IgE: <0.1 kU/L
Cladosporium Herbarum IgE: <0.1 kU/L
Cockroach, German IgE: <0.1 kU/L
Common Silver Birch IgE: <0.1 kU/L
Cottonwood IgE: <0.1 kU/L
D Farinae IgE: <0.1 kU/L
D Pteronyssinus IgE: <0.1 kU/L
E001-IgE Cat Dander: <0.1 kU/L
E005-IgE Dog Dander: <0.1 kU/L
Elm, American IgE: <0.1 kU/L
IgE (Immunoglobulin E), Serum: 56 IU/mL (ref 6–495)
Johnson Grass IgE: <0.1 kU/L
Maple/Box Elder IgE: <0.1 kU/L
Mouse Urine IgE: <0.1 kU/L
Oak, White IgE: <0.1 kU/L
Pecan, Hickory IgE: <0.1 kU/L
Penicillium Chrysogen IgE: <0.1 kU/L
Pigweed, Rough IgE: <0.1 kU/L
Ragweed, Short IgE: <0.1 kU/L
Sheep Sorrel IgE Qn: <0.1 kU/L
Timothy Grass IgE: <0.1 kU/L
White Mulberry IgE: <0.1 kU/L

## 2021-02-18 LAB — STREP PNEUMONIAE 23 SEROTYPES IGG
Pneumo Ab Type 1*: <0.1 ug/mL — ABNORMAL LOW (ref >1.3)
Pneumo Ab Type 12 (12F)*: <0.1 ug/mL — ABNORMAL LOW (ref >1.3)
Pneumo Ab Type 14*: 0.3 ug/mL — ABNORMAL LOW (ref >1.3)
Pneumo Ab Type 17 (17F)*: <0.1 ug/mL — ABNORMAL LOW (ref >1.3)
Pneumo Ab Type 19 (19F)*: 0.4 ug/mL — ABNORMAL LOW (ref >1.3)
Pneumo Ab Type 2*: <0.1 ug/mL — ABNORMAL LOW (ref >1.3)
Pneumo Ab Type 20*: 0.4 ug/mL — ABNORMAL LOW (ref >1.3)
Pneumo Ab Type 22 (22F)*: <0.1 ug/mL — ABNORMAL LOW (ref >1.3)
Pneumo Ab Type 23 (23F)*: <0.1 ug/mL — ABNORMAL LOW (ref >1.3)
Pneumo Ab Type 26 (6B)*: <0.1 ug/mL — ABNORMAL LOW (ref >1.3)
Pneumo Ab Type 3*: 0.4 ug/mL — ABNORMAL LOW (ref >1.3)
Pneumo Ab Type 34 (10A)*: 0.4 ug/mL — ABNORMAL LOW (ref >1.3)
Pneumo Ab Type 4*: <0.1 ug/mL — ABNORMAL LOW (ref >1.3)
Pneumo Ab Type 43 (11A)*: <0.1 ug/mL — ABNORMAL LOW (ref >1.3)
Pneumo Ab Type 5*: <0.1 ug/mL — ABNORMAL LOW (ref >1.3)
Pneumo Ab Type 51 (7F)*: 0.6 ug/mL — ABNORMAL LOW (ref >1.3)
Pneumo Ab Type 54 (15B)*: 0.6 ug/mL — ABNORMAL LOW (ref >1.3)
Pneumo Ab Type 56 (18C)*: 0.5 ug/mL — ABNORMAL LOW (ref >1.3)
Pneumo Ab Type 57 (19A)*: 0.4 ug/mL — ABNORMAL LOW (ref >1.3)
Pneumo Ab Type 68 (9V)*: 0.6 ug/mL — ABNORMAL LOW (ref >1.3)
Pneumo Ab Type 70 (33F)*: 0.2 ug/mL — ABNORMAL LOW (ref >1.3)
Pneumo Ab Type 8*: <0.1 ug/mL — ABNORMAL LOW (ref >1.3)
Pneumo Ab Type 9 (9N)*: <0.1 ug/mL — ABNORMAL LOW (ref >1.3)

## 2021-02-18 LAB — IGG, IGA, IGM
IgA/Immunoglobulin A, Serum: 308 mg/dL (ref 87–352)
IgG (Immunoglobin G), Serum: 863 mg/dL (ref 586–1602)
IgM (Immunoglobulin M), Srm: 113 mg/dL (ref 26–217)

## 2021-02-18 LAB — ALLERGY PANEL 19, SEAFOOD GROUP
Allergen Salmon IgE: <0.1 kU/L
Catfish: <0.1 kU/L
Codfish IgE: <0.1 kU/L
F023-IgE Crab: <0.1 kU/L
F080-IgE Lobster: <0.1 kU/L
Shrimp IgE: <0.1 kU/L
Tuna: <0.1 kU/L

## 2021-02-18 LAB — T-HELPER CELLS CD4/CD8 %
% CD 4 Pos. Lymph.: 61.1 % — ABNORMAL HIGH (ref 30.8–58.5)
Absolute CD 4 Helper: 1039 /uL (ref 359–1519)
CD3+CD4+ Cells/CD3+CD8+ Cells Bld: 3.32 (ref 0.92–3.72)
CD3+CD8+ Cells # Bld: 313 /uL (ref 109–897)
CD3+CD8+ Cells NFr Bld: 18.4 % (ref 12.0–35.5)

## 2021-02-18 LAB — DIPHTHERIA / TETANUS ANTIBODY PANEL
Diphtheria Ab: 0.99 IU/mL (ref <0.10)
Tetanus Ab, IgG: 4.91 IU/mL (ref <0.10)

## 2021-02-18 LAB — COMPLEMENT, TOTAL: Compl, Total (CH50): >60 U/mL (ref >41)

## 2021-03-08 DIAGNOSIS — G35 Multiple sclerosis: Secondary | ICD-10-CM | POA: Diagnosis not present

## 2021-03-08 DIAGNOSIS — I7300 Raynaud's syndrome without gangrene: Secondary | ICD-10-CM | POA: Diagnosis not present

## 2021-03-08 DIAGNOSIS — M797 Fibromyalgia: Secondary | ICD-10-CM | POA: Diagnosis not present

## 2021-03-08 DIAGNOSIS — F900 Attention-deficit hyperactivity disorder, predominantly inattentive type: Secondary | ICD-10-CM | POA: Diagnosis not present

## 2021-03-08 DIAGNOSIS — R5383 Other fatigue: Secondary | ICD-10-CM | POA: Diagnosis not present

## 2021-03-08 DIAGNOSIS — E7849 Other hyperlipidemia: Secondary | ICD-10-CM | POA: Diagnosis not present

## 2021-03-08 DIAGNOSIS — N1831 Chronic kidney disease, stage 3a: Secondary | ICD-10-CM | POA: Diagnosis not present

## 2021-03-08 DIAGNOSIS — G43009 Migraine without aura, not intractable, without status migrainosus: Secondary | ICD-10-CM | POA: Diagnosis not present

## 2021-03-28 DIAGNOSIS — E7849 Other hyperlipidemia: Secondary | ICD-10-CM | POA: Diagnosis not present

## 2021-03-28 DIAGNOSIS — N183 Chronic kidney disease, stage 3 unspecified: Secondary | ICD-10-CM | POA: Diagnosis not present

## 2021-04-27 DIAGNOSIS — N183 Chronic kidney disease, stage 3 unspecified: Secondary | ICD-10-CM | POA: Diagnosis not present

## 2021-04-27 DIAGNOSIS — E7849 Other hyperlipidemia: Secondary | ICD-10-CM | POA: Diagnosis not present

## 2021-04-27 DIAGNOSIS — K219 Gastro-esophageal reflux disease without esophagitis: Secondary | ICD-10-CM | POA: Diagnosis not present

## 2021-05-04 DIAGNOSIS — E7849 Other hyperlipidemia: Secondary | ICD-10-CM | POA: Diagnosis not present

## 2021-05-04 DIAGNOSIS — M797 Fibromyalgia: Secondary | ICD-10-CM | POA: Diagnosis not present

## 2021-05-04 DIAGNOSIS — G35 Multiple sclerosis: Secondary | ICD-10-CM | POA: Diagnosis not present

## 2021-05-04 DIAGNOSIS — I7300 Raynaud's syndrome without gangrene: Secondary | ICD-10-CM | POA: Diagnosis not present

## 2021-05-04 DIAGNOSIS — G43009 Migraine without aura, not intractable, without status migrainosus: Secondary | ICD-10-CM | POA: Diagnosis not present

## 2021-05-04 DIAGNOSIS — R4582 Worries: Secondary | ICD-10-CM | POA: Diagnosis not present

## 2021-05-04 DIAGNOSIS — R5383 Other fatigue: Secondary | ICD-10-CM | POA: Diagnosis not present

## 2021-05-04 DIAGNOSIS — F900 Attention-deficit hyperactivity disorder, predominantly inattentive type: Secondary | ICD-10-CM | POA: Diagnosis not present

## 2021-05-28 DIAGNOSIS — N183 Chronic kidney disease, stage 3 unspecified: Secondary | ICD-10-CM | POA: Diagnosis not present

## 2021-05-28 DIAGNOSIS — E7849 Other hyperlipidemia: Secondary | ICD-10-CM | POA: Diagnosis not present

## 2021-05-28 DIAGNOSIS — K219 Gastro-esophageal reflux disease without esophagitis: Secondary | ICD-10-CM | POA: Diagnosis not present

## 2021-06-18 DIAGNOSIS — S92325K Nondisplaced fracture of second metatarsal bone, left foot, subsequent encounter for fracture with nonunion: Secondary | ICD-10-CM | POA: Diagnosis not present

## 2021-06-18 DIAGNOSIS — M79672 Pain in left foot: Secondary | ICD-10-CM | POA: Diagnosis not present

## 2021-06-18 DIAGNOSIS — L84 Corns and callosities: Secondary | ICD-10-CM | POA: Diagnosis not present

## 2021-06-27 DIAGNOSIS — F900 Attention-deficit hyperactivity disorder, predominantly inattentive type: Secondary | ICD-10-CM | POA: Diagnosis not present

## 2021-06-27 DIAGNOSIS — E7849 Other hyperlipidemia: Secondary | ICD-10-CM | POA: Diagnosis not present

## 2021-06-27 DIAGNOSIS — N183 Chronic kidney disease, stage 3 unspecified: Secondary | ICD-10-CM | POA: Diagnosis not present

## 2021-06-27 DIAGNOSIS — M797 Fibromyalgia: Secondary | ICD-10-CM | POA: Diagnosis not present

## 2021-06-27 DIAGNOSIS — K219 Gastro-esophageal reflux disease without esophagitis: Secondary | ICD-10-CM | POA: Diagnosis not present

## 2021-07-15 DIAGNOSIS — Z1329 Encounter for screening for other suspected endocrine disorder: Secondary | ICD-10-CM | POA: Diagnosis not present

## 2021-07-15 DIAGNOSIS — D519 Vitamin B12 deficiency anemia, unspecified: Secondary | ICD-10-CM | POA: Diagnosis not present

## 2021-07-15 DIAGNOSIS — E559 Vitamin D deficiency, unspecified: Secondary | ICD-10-CM | POA: Diagnosis not present

## 2021-07-15 DIAGNOSIS — I7300 Raynaud's syndrome without gangrene: Secondary | ICD-10-CM | POA: Diagnosis not present

## 2021-07-15 DIAGNOSIS — K2100 Gastro-esophageal reflux disease with esophagitis, without bleeding: Secondary | ICD-10-CM | POA: Diagnosis not present

## 2021-07-15 DIAGNOSIS — N183 Chronic kidney disease, stage 3 unspecified: Secondary | ICD-10-CM | POA: Diagnosis not present

## 2021-07-15 DIAGNOSIS — E7849 Other hyperlipidemia: Secondary | ICD-10-CM | POA: Diagnosis not present

## 2021-07-15 DIAGNOSIS — R971 Elevated cancer antigen 125 [CA 125]: Secondary | ICD-10-CM | POA: Diagnosis not present

## 2021-07-15 DIAGNOSIS — D649 Anemia, unspecified: Secondary | ICD-10-CM | POA: Diagnosis not present

## 2021-07-15 DIAGNOSIS — Z1388 Encounter for screening for disorder due to exposure to contaminants: Secondary | ICD-10-CM | POA: Diagnosis not present

## 2021-07-15 DIAGNOSIS — D529 Folate deficiency anemia, unspecified: Secondary | ICD-10-CM | POA: Diagnosis not present

## 2021-07-15 DIAGNOSIS — M797 Fibromyalgia: Secondary | ICD-10-CM | POA: Diagnosis not present

## 2021-07-15 DIAGNOSIS — E782 Mixed hyperlipidemia: Secondary | ICD-10-CM | POA: Diagnosis not present

## 2021-07-19 DIAGNOSIS — F900 Attention-deficit hyperactivity disorder, predominantly inattentive type: Secondary | ICD-10-CM | POA: Diagnosis not present

## 2021-07-19 DIAGNOSIS — R5383 Other fatigue: Secondary | ICD-10-CM | POA: Diagnosis not present

## 2021-07-19 DIAGNOSIS — M797 Fibromyalgia: Secondary | ICD-10-CM | POA: Diagnosis not present

## 2021-07-19 DIAGNOSIS — N1831 Chronic kidney disease, stage 3a: Secondary | ICD-10-CM | POA: Diagnosis not present

## 2021-07-19 DIAGNOSIS — G43009 Migraine without aura, not intractable, without status migrainosus: Secondary | ICD-10-CM | POA: Diagnosis not present

## 2021-07-19 DIAGNOSIS — G35 Multiple sclerosis: Secondary | ICD-10-CM | POA: Diagnosis not present

## 2021-07-19 DIAGNOSIS — I7300 Raynaud's syndrome without gangrene: Secondary | ICD-10-CM | POA: Diagnosis not present

## 2021-07-19 DIAGNOSIS — Z0001 Encounter for general adult medical examination with abnormal findings: Secondary | ICD-10-CM | POA: Diagnosis not present

## 2021-07-28 DIAGNOSIS — N183 Chronic kidney disease, stage 3 unspecified: Secondary | ICD-10-CM | POA: Diagnosis not present

## 2021-07-28 DIAGNOSIS — M797 Fibromyalgia: Secondary | ICD-10-CM | POA: Diagnosis not present

## 2021-07-28 DIAGNOSIS — K219 Gastro-esophageal reflux disease without esophagitis: Secondary | ICD-10-CM | POA: Diagnosis not present

## 2021-07-28 DIAGNOSIS — E7849 Other hyperlipidemia: Secondary | ICD-10-CM | POA: Diagnosis not present

## 2021-07-28 DIAGNOSIS — F900 Attention-deficit hyperactivity disorder, predominantly inattentive type: Secondary | ICD-10-CM | POA: Diagnosis not present

## 2021-08-16 IMAGING — CT CT 3D INDEPENDENT WKST
1 series · 12 of 14 positions shown, 15 images · non-contrast
Comparison: X-ray 09/09/2019

CLINICAL DATA: Fall, left knee pain, suspected tibial plateau
fracture.

EXAM:
CT OF THE LEFT KNEE WITHOUT CONTRAST
3-DIMENSIONAL CT IMAGE RENDERING ON ACQUISITION WORKSTATION
TECHNIQUE: Multidetector CT imaging of the left knee was performed according to
the standard protocol. Multiplanar CT image reconstructions were
also generated. 3-dimensional CT images were rendered by
post-processing of the original CT data on an acquisition
workstation. The 3-dimensional CT images were interpreted and
findings were reported in the accompanying complete CT report for
this study

[Series 4: knee soft tissue · axial · 0.42mm/px · z∈[-320,-86]mm · 12 of 94 slices shown, 15 images]
[im 8/94  soft-tissue]
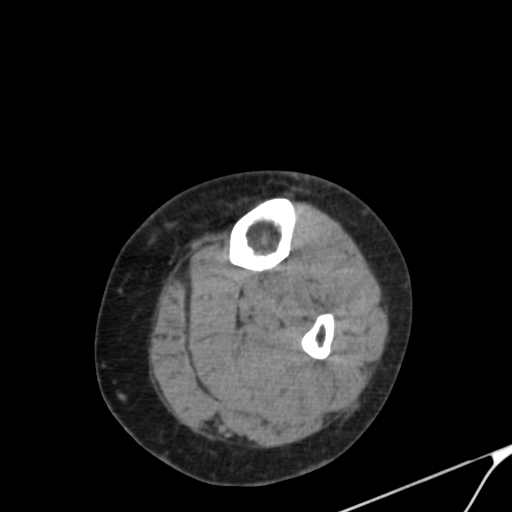
[im 8/94  bone]
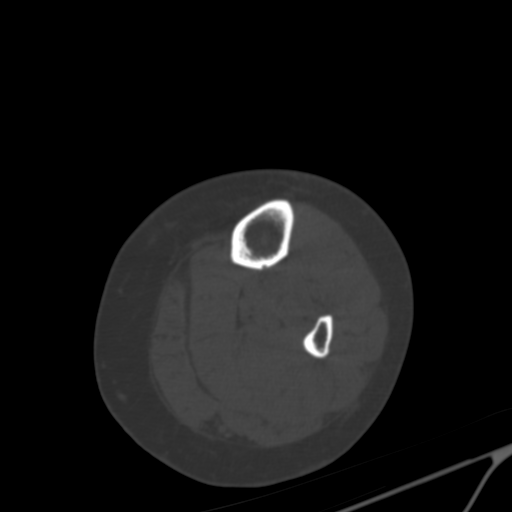
[im 15/94  bone]
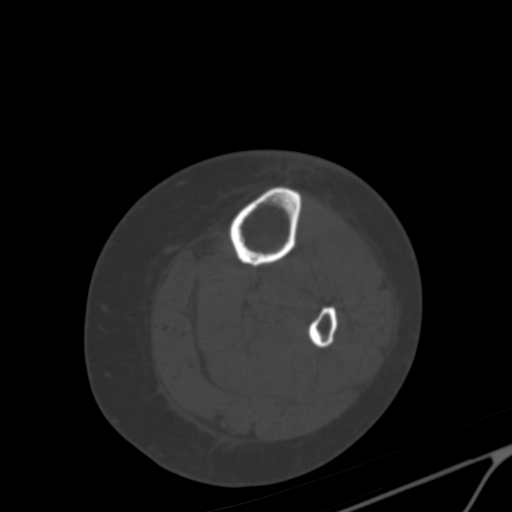
[im 22/94  bone]
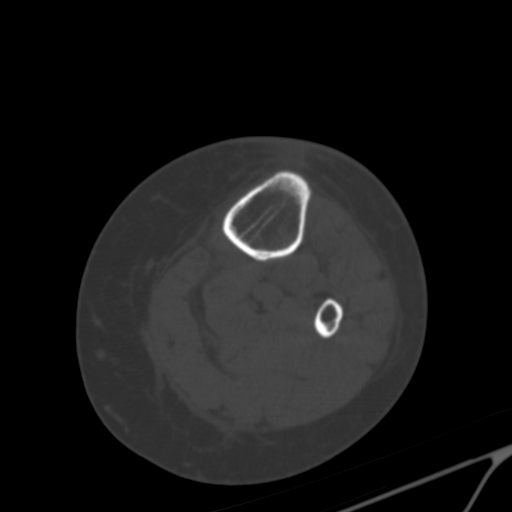
[im 29/94  bone]
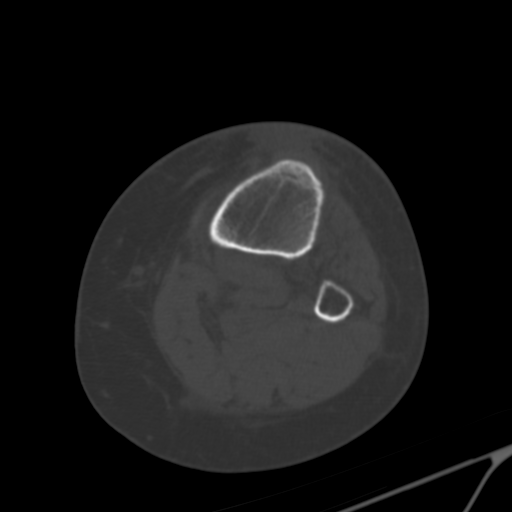
[im 36/94  soft-tissue]
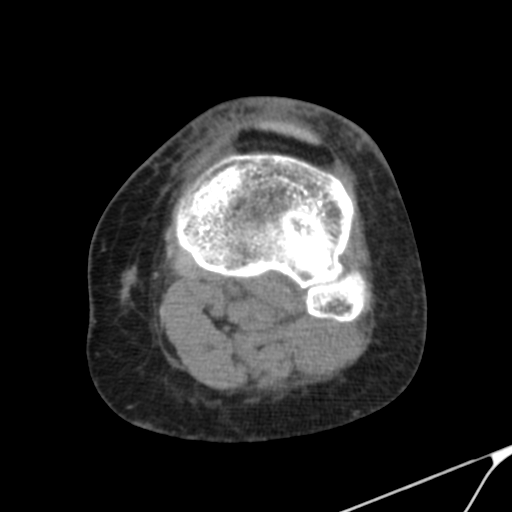
[im 36/94  bone]
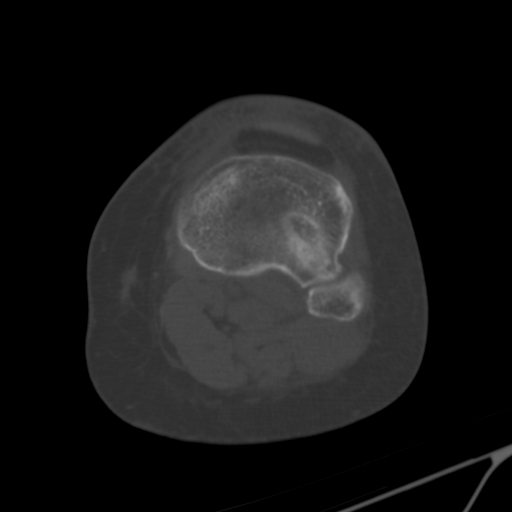
[im 43/94  bone]
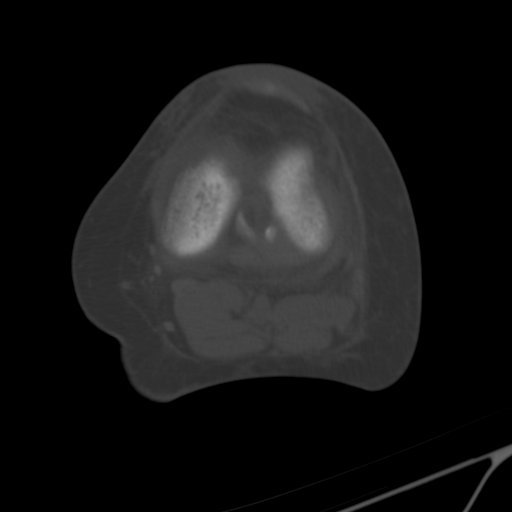
[im 51/94  bone]
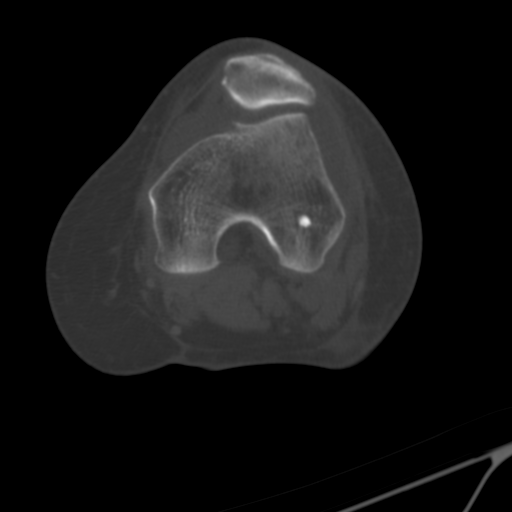
[im 58/94  bone]
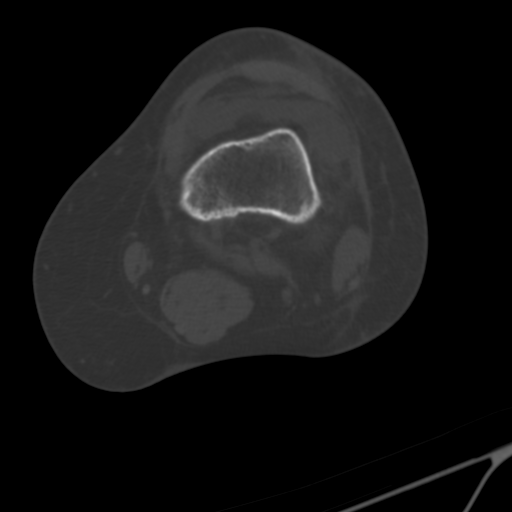
[im 65/94  soft-tissue]
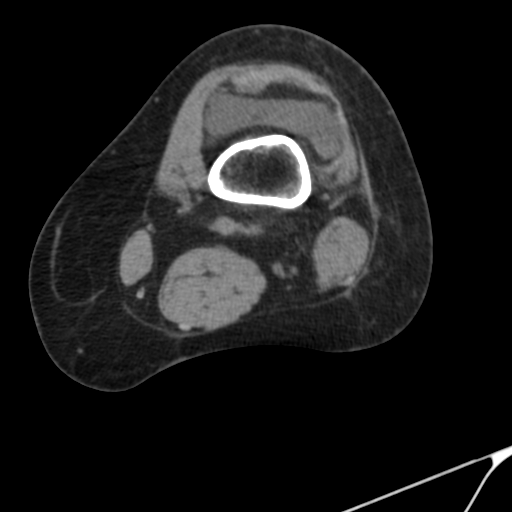
[im 65/94  bone]
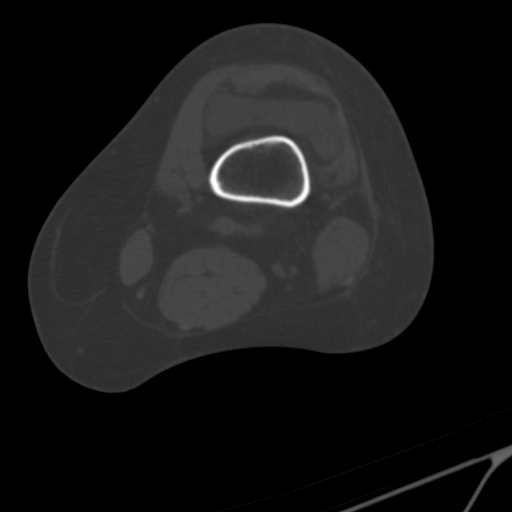
[im 72/94  bone]
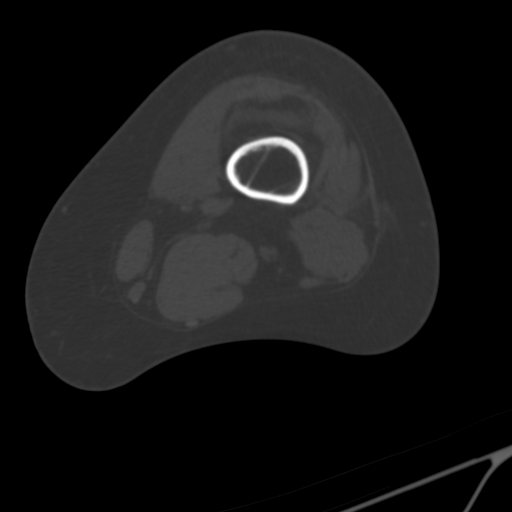
[im 79/94  bone]
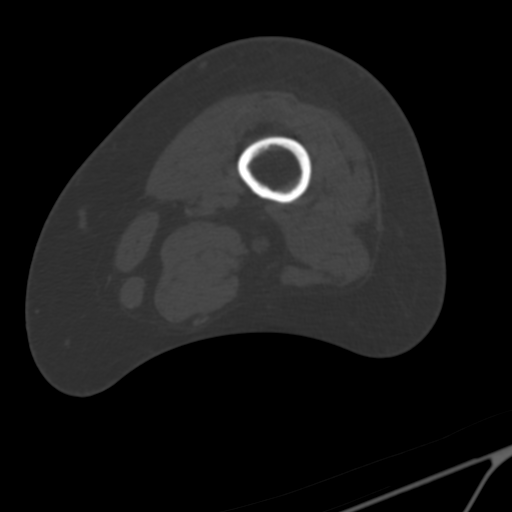
[im 86/94  bone]
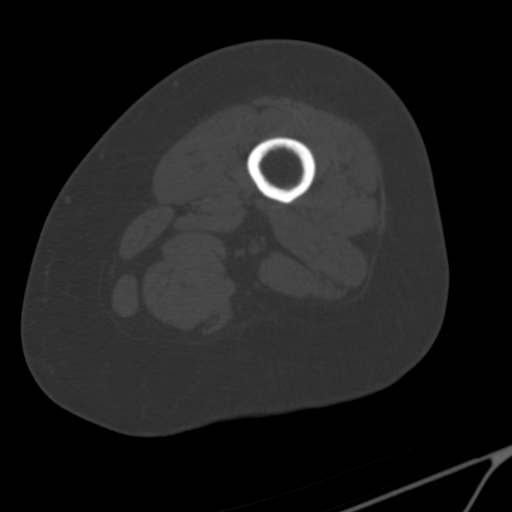

[12 of 14 positions shown; findings below may reference images not displayed]

FINDINGS: Bones/Joint/Cartilage

Lateral tibial plateau fracture with minimal 1 mm articular surface
depression along the lateral fracture margin (series 7, image 34;
series 8, image 44). Nondisplaced fracture component involves the
tibial eminence centrally and posteriorly. No fracture extension to
the medial tibial plateau. No definite intra-articular extension to
the proximal tibiofibular joint. Knee joint space is maintained. The
distal femur, proximal fibula, and patella are intact without
fracture. Moderate-sized lipohemarthrosis.

Ligaments

Suboptimally assessed by CT. The cruciate ligaments appear intact
within the limitations of CT.

Muscles and Tendons

No evidence of acute myotendinous injury.

Soft tissues

Mild prepatellar soft tissue edema. No soft tissue fluid collection
or hematoma.
IMPRESSION: 1. Lateral tibial plateau fracture with minimal articular surface
depression along the lateral fracture margin.
2. Moderate-sized lipohemarthrosis.

## 2021-08-16 IMAGING — CT CT KNEE*L* W/O CM
1 series · 12 of 14 positions shown, 15 images · non-contrast
Comparison: X-ray 09/09/2019

CLINICAL DATA: Fall, left knee pain, suspected tibial plateau
fracture.

EXAM:
CT OF THE LEFT KNEE WITHOUT CONTRAST
3-DIMENSIONAL CT IMAGE RENDERING ON ACQUISITION WORKSTATION
TECHNIQUE: Multidetector CT imaging of the left knee was performed according to
the standard protocol. Multiplanar CT image reconstructions were
also generated. 3-dimensional CT images were rendered by
post-processing of the original CT data on an acquisition
workstation. The 3-dimensional CT images were interpreted and
findings were reported in the accompanying complete CT report for
this study

[Series 4: knee soft tissue · axial · 0.42mm/px · z∈[-320,-86]mm · 12 of 94 slices shown, 15 images]
[im 8/94  soft-tissue]
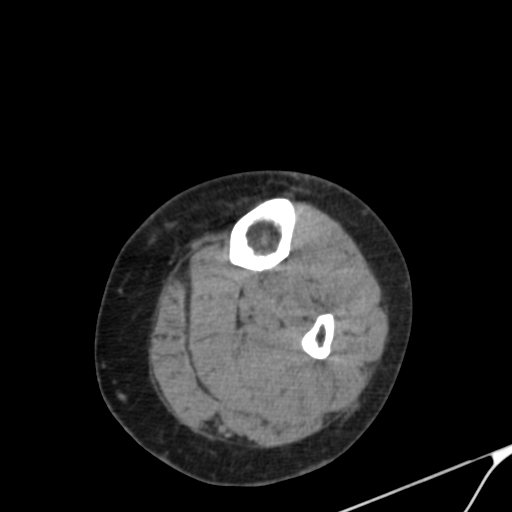
[im 8/94  bone]
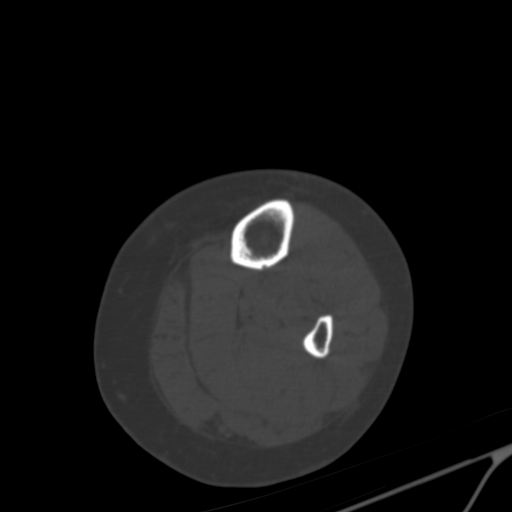
[im 15/94  bone]
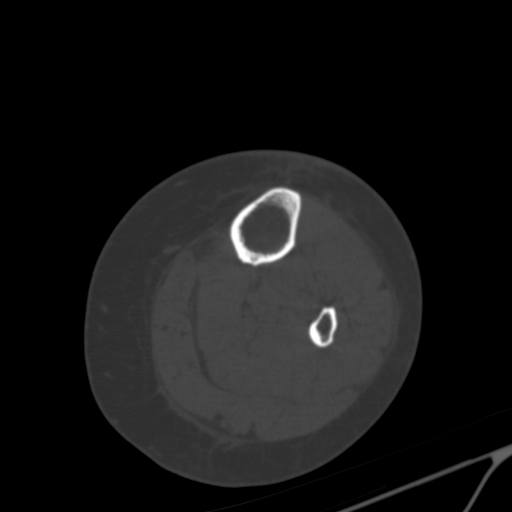
[im 22/94  bone]
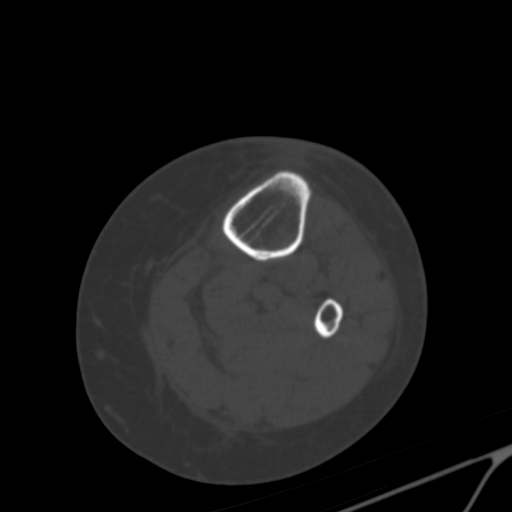
[im 29/94  bone]
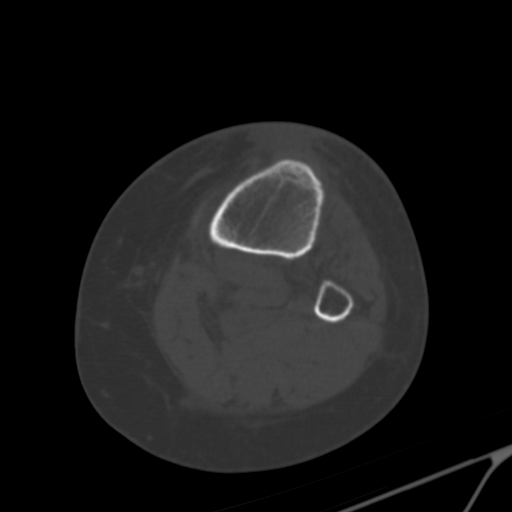
[im 36/94  soft-tissue]
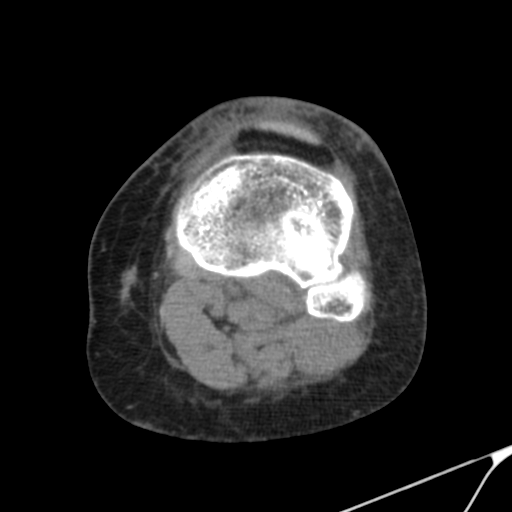
[im 36/94  bone]
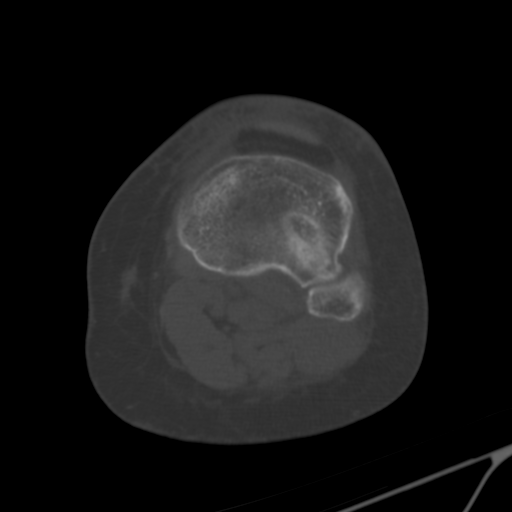
[im 43/94  bone]
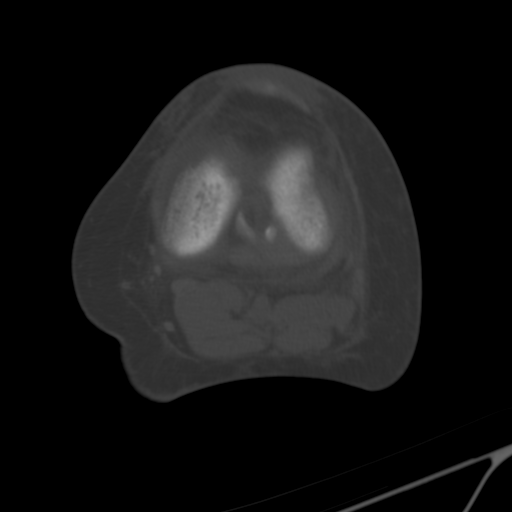
[im 51/94  bone]
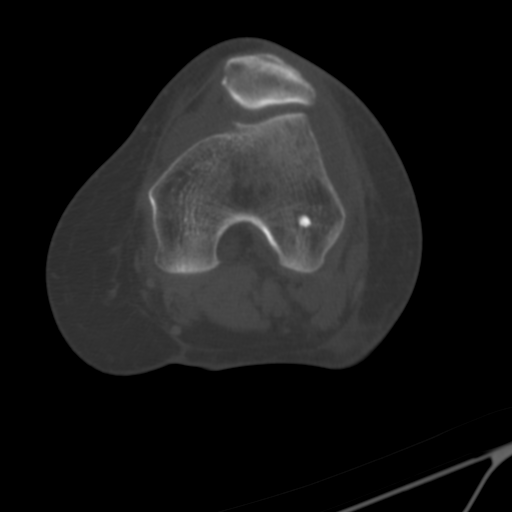
[im 58/94  bone]
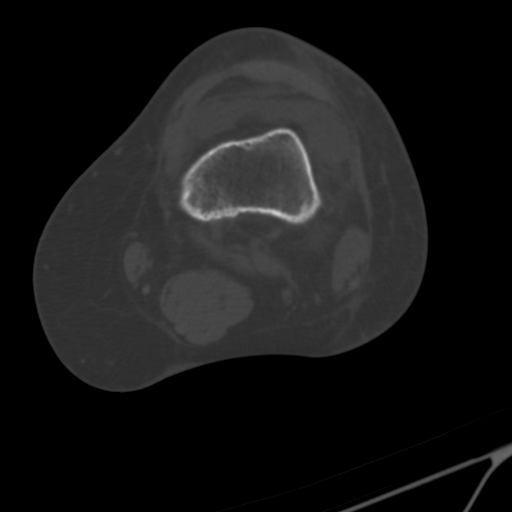
[im 65/94  soft-tissue]
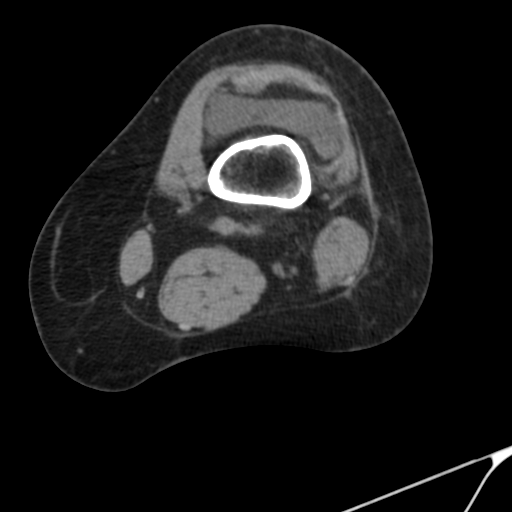
[im 65/94  bone]
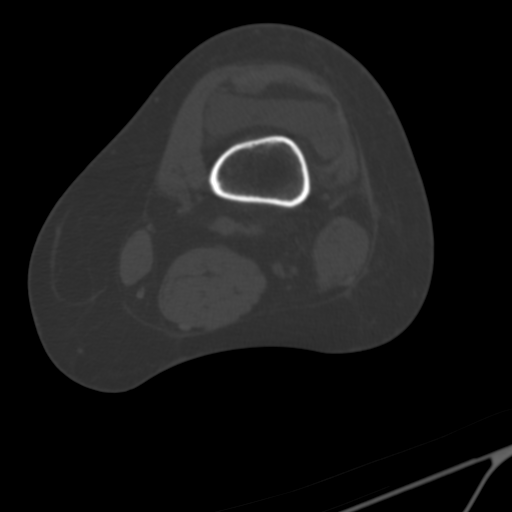
[im 72/94  bone]
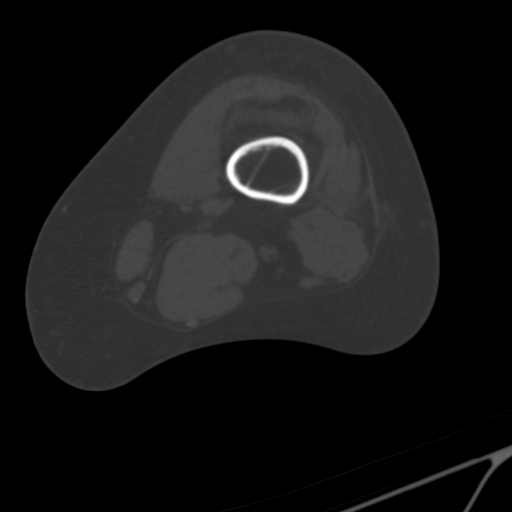
[im 79/94  bone]
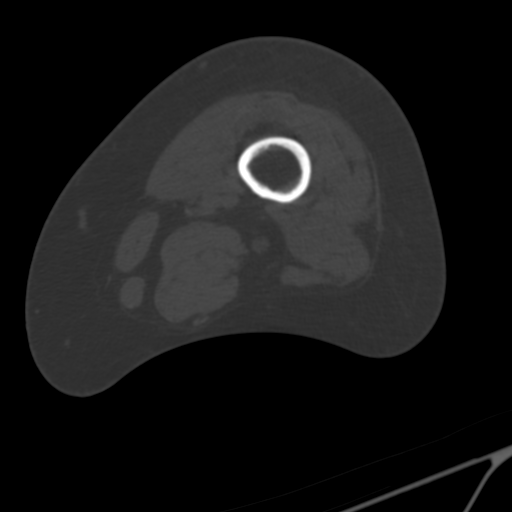
[im 86/94  bone]
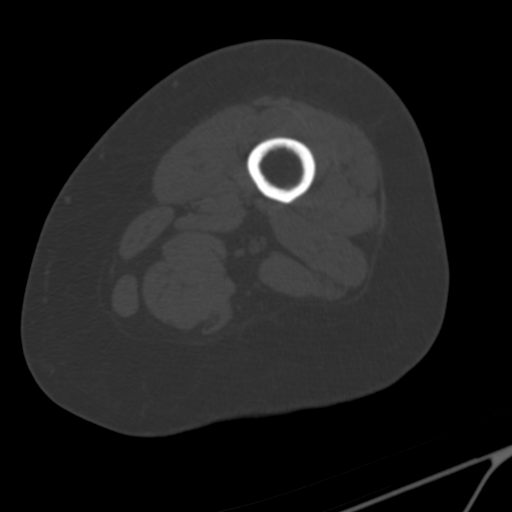

[12 of 14 positions shown; findings below may reference images not displayed]

FINDINGS: Bones/Joint/Cartilage

Lateral tibial plateau fracture with minimal 1 mm articular surface
depression along the lateral fracture margin (series 7, image 34;
series 8, image 44). Nondisplaced fracture component involves the
tibial eminence centrally and posteriorly. No fracture extension to
the medial tibial plateau. No definite intra-articular extension to
the proximal tibiofibular joint. Knee joint space is maintained. The
distal femur, proximal fibula, and patella are intact without
fracture. Moderate-sized lipohemarthrosis.

Ligaments

Suboptimally assessed by CT. The cruciate ligaments appear intact
within the limitations of CT.

Muscles and Tendons

No evidence of acute myotendinous injury.

Soft tissues

Mild prepatellar soft tissue edema. No soft tissue fluid collection
or hematoma.
IMPRESSION: 1. Lateral tibial plateau fracture with minimal articular surface
depression along the lateral fracture margin.
2. Moderate-sized lipohemarthrosis.

## 2021-08-27 DIAGNOSIS — F900 Attention-deficit hyperactivity disorder, predominantly inattentive type: Secondary | ICD-10-CM | POA: Diagnosis not present

## 2021-08-27 DIAGNOSIS — M797 Fibromyalgia: Secondary | ICD-10-CM | POA: Diagnosis not present

## 2021-08-27 DIAGNOSIS — E7849 Other hyperlipidemia: Secondary | ICD-10-CM | POA: Diagnosis not present

## 2021-09-09 ENCOUNTER — Encounter (HOSPITAL_COMMUNITY): Payer: Self-pay

## 2021-09-09 ENCOUNTER — Other Ambulatory Visit: Payer: Self-pay

## 2021-09-09 ENCOUNTER — Emergency Department (HOSPITAL_COMMUNITY)
Admission: EM | Admit: 2021-09-09 | Discharge: 2021-09-09 | Disposition: A | Discharge status: Home / Self Care | Payer: PPO | Attending: Emergency Medicine | Admitting: Emergency Medicine

## 2021-09-09 DIAGNOSIS — R58 Hemorrhage, not elsewhere classified: Secondary | ICD-10-CM | POA: Diagnosis not present

## 2021-09-09 DIAGNOSIS — W57XXXA Bitten or stung by nonvenomous insect and other nonvenomous arthropods, initial encounter: Secondary | ICD-10-CM | POA: Insufficient documentation

## 2021-09-09 DIAGNOSIS — W5581XA Bitten by other mammals, initial encounter: Secondary | ICD-10-CM | POA: Diagnosis not present

## 2021-09-09 DIAGNOSIS — R0689 Other abnormalities of breathing: Secondary | ICD-10-CM | POA: Diagnosis not present

## 2021-09-09 DIAGNOSIS — T63001A Toxic effect of unspecified snake venom, accidental (unintentional), initial encounter: Secondary | ICD-10-CM | POA: Insufficient documentation

## 2021-09-09 DIAGNOSIS — W5911XA Bitten by nonvenomous snake, initial encounter: Secondary | ICD-10-CM

## 2021-09-09 DIAGNOSIS — Z7982 Long term (current) use of aspirin: Secondary | ICD-10-CM | POA: Insufficient documentation

## 2021-09-09 DIAGNOSIS — Z79899 Other long term (current) drug therapy: Secondary | ICD-10-CM | POA: Diagnosis not present

## 2021-09-09 DIAGNOSIS — S70362A Insect bite (nonvenomous), left thigh, initial encounter: Secondary | ICD-10-CM | POA: Insufficient documentation

## 2021-09-09 LAB — CBC WITH DIFFERENTIAL/PLATELET
Abs Immature Granulocytes: 0.01 10*3/uL (ref 0.00–0.07)
Basophils Absolute: 0.1 10*3/uL (ref 0.0–0.1)
Basophils Relative: 1 %
Eosinophils Absolute: 0.3 10*3/uL (ref 0.0–0.5)
Eosinophils Relative: 4 %
HCT: 41.6 % (ref 36.0–46.0)
Hemoglobin: 13.2 g/dL (ref 12.0–15.0)
Immature Granulocytes: 0 %
Lymphocytes Relative: 33 %
Lymphs Abs: 2.1 10*3/uL (ref 0.7–4.0)
MCH: 30.4 pg (ref 26.0–34.0)
MCHC: 31.7 g/dL (ref 30.0–36.0)
MCV: 95.9 fL (ref 80.0–100.0)
Monocytes Absolute: 0.4 10*3/uL (ref 0.1–1.0)
Monocytes Relative: 7 %
Neutro Abs: 3.4 10*3/uL (ref 1.7–7.7)
Neutrophils Relative %: 55 %
Platelets: 241 10*3/uL (ref 150–400)
RBC: 4.34 MIL/uL (ref 3.87–5.11)
RDW: 13.8 % (ref 11.5–15.5)
WBC: 6.3 10*3/uL (ref 4.0–10.5)
nRBC: 0 % (ref 0.0–0.2)

## 2021-09-09 LAB — PROTIME-INR
INR: 1 (ref 0.8–1.2)
Prothrombin Time: 13.5 seconds (ref 11.4–15.2)

## 2021-09-09 LAB — FIBRINOGEN: Fibrinogen: 345 mg/dL (ref 210–475)

## 2021-09-09 NOTE — ED Notes (Signed)
This RN spoke with [employer]. Their Recommendation is as follows:  Preferably monitor pt 8 hours post bite Make sure tetanus shot is up to date 6 hours post bite run labs: CBC, PtINR, Fibrinogen  AVOID: NSAIDS for 48 hrs and morphine  If pt has significant swelling: give 4-6 vials of croFab antivenom  Beta blockers and increase reaction to venom, recommend to premedicate if given antivenom with prednisone and benadryl  Monitor VS

## 2021-09-09 NOTE — ED Notes (Signed)
Cleaned pt's left leg and foot with soap and water

## 2021-09-09 NOTE — Discharge Instructions (Addendum)
If you develop new or worsening pain, swelling, numbness or weakness in the leg, or any other new/concerning symptoms then return to the ER for evaluation.

## 2021-09-09 NOTE — ED Provider Notes (Signed)
[facility] ([place]) EMERGENCY DEPARTMENT Provider Note   CSN: [id number] Arrival date & time: 09/09/21  1641     History  Chief Complaint  Patient presents with   Snake Bite    Nancy Byrd is a 56 y.o. female.  HPI 56 year old female presents with concern for a snakebite to the left distal posterior thigh.  This occurred around 3:15 PM.  She was harvesting blueberries and felt a sudden pain and injury.  She had extensive bleeding.  Right now the area is sore and she rates the pain is about a 4 out of 10.  Bleeding has stopped and there is a little bit of small ecchymosis.  She did not actually see a snake.  She was brought in here by ambulance for possible snakebite. Tetanus immunization is up to date.  Home Medications Prior to Admission medications   Medication Sig Start Date End Date Taking? Authorizing Provider  acetaminophen (TYLENOL) 500 MG tablet Take 1,000 mg by mouth every 8 (eight) hours as needed.   Yes [provider]  amphetamine-dextroamphetamine (ADDERALL XR) 30 MG 24 hr capsule Take 30 mg by mouth 2 (two) times daily. 11AM and 2PM   Yes [provider]  amphetamine-dextroamphetamine (ADDERALL) 30 MG tablet Take 60 mg by mouth daily. 6AM   Yes [provider]  Ascorbic Acid (VITAMIN C) 1000 MG tablet Take 1,000 mg by mouth daily.   Yes [provider]  aspirin EC 81 MG tablet Take 81 mg by mouth daily.   Yes [provider]  Biotin 1000 MCG CHEW Chew 1 tablet by mouth daily.   Yes [provider]  busPIRone (BUSPAR) 10 MG tablet Take 20 mg by mouth 3 (three) times daily. 04/10/17  Yes [provider]  Calcium Carbonate-Vitamin D (CALCIUM + D PO) Take 1,200 mg by mouth daily.   Yes [provider]  cetirizine (ZYRTEC) 10 MG tablet Take 10 mg by mouth daily.   Yes [provider]  Cholecalciferol (VITAMIN D3) 50 MCG (2000 UT) TABS Take 1 tablet by mouth daily.   Yes [provider]   cyanocobalamin (,VITAMIN B-12,) 1000 MCG/ML injection 2,000 mcg every 30 (thirty) days. 01/08/21  Yes [provider]  DULoxetine (CYMBALTA) 60 MG capsule Take 2 capsules (120 mg total) by mouth daily. 10/17/16  Yes Sater, Nanine Means, MD  EPINEPHrine 0.3 mg/0.3 mL IJ SOAJ injection Inject 0.3 mg into the muscle once.   Yes [provider]  L-Lysine 500 MG TABS Take 1 tablet by mouth daily.   Yes [provider]  lactase (LACTAID) 3000 UNITS tablet Take by mouth 3 (three) times daily with meals.   Yes [provider]  Lactobacillus (ACIDOPHILUS PO) Take 175 mg by mouth daily.   Yes [provider]  magnesium oxide (MAG-OX) 400 (240 Mg) MG tablet Take 1 tablet by mouth 2 (two) times daily. 01/08/21  Yes [provider]  meclizine (ANTIVERT) 12.5 MG tablet Take 12.5 mg by mouth 3 (three) times daily as needed. 08/25/21  Yes [provider]  MYRBETRIQ 50 MG TB24 tablet TAKE ONE (1) TABLET BY MOUTH EVERY DAY 11/14/16  Yes Sater, Nanine Means, MD  Omega-3 1000 MG CAPS Take by mouth.   Yes [provider]  ondansetron (ZOFRAN) 4 MG tablet Take 1 mg by mouth every 8 (eight) hours as needed for nausea or vomiting. 06/07/16  Yes Regal, Tamala Fothergill, DPM  rOPINIRole (REQUIP) 0.25 MG tablet Take 1 tablet (0.25 mg  total) by mouth as needed. 10/15/15  Yes Sater, Nanine Means, MD  scopolamine (TRANSDERM-SCOP) 1 MG/3DAYS Place 1 patch onto the skin every 3 (three) days.   Yes [provider]  SOYA LECITHIN PO Take 1,200 mg by mouth daily.   Yes [provider]  topiramate (TOPAMAX) 200 MG tablet Take 400 mg by mouth 2 (two) times daily.   Yes [provider]  torsemide (DEMADEX) 20 MG tablet Take 20 mg by mouth 2 (two) times daily.   Yes [provider]  vitamin B-12 (CYANOCOBALAMIN) 1000 MCG tablet Take 5,000 mcg by mouth daily.   Yes [provider]  Vitamin E 180 MG (400 UNIT) CAPS Take 1 capsule by mouth  daily. 01/08/21  Yes [provider]  Zinc 50 MG CAPS Take 1 capsule by mouth daily.   Yes [provider]  zolmitriptan (ZOMIG) 5 MG tablet Take 5 mg by mouth as needed for migraine.   Yes [provider]  ondansetron (ZOFRAN) 8 MG tablet 1 tablet as needed by oral route.    [provider]      Allergies    Enbrel [etanercept], Interferon beta-1a, Other, Betadine [povidone iodine], Shellfish allergy, Tdap [tetanus-diphth-acell pertussis], Amoxicillin, and Iohexol    Review of Systems   Review of Systems  Skin:  Positive for wound.    Physical Exam Updated Vital Signs BP (!) 150/85   Pulse (!) 55   Temp 98.1 F (36.7 C) (Oral)   Resp 16   Ht '5\' 4"'$  (1.626 m)   Wt 70.3 kg   SpO2 100%   BMI 26.61 kg/m  Physical Exam Vitals and nursing note reviewed.  Constitutional:      Appearance: She is well-developed.  HENT:     Head: Normocephalic and atraumatic.  Cardiovascular:     Rate and Rhythm: Normal rate and regular rhythm.     Pulses:          Posterior tibial pulses are 2+ on the left side.  Pulmonary:     Effort: Pulmonary effort is normal.  Abdominal:     General: There is no distension.  Musculoskeletal:       Legs:  Skin:    General: Skin is warm and dry.  Neurological:     Mental Status: She is alert.     ED Results / Procedures / Treatments   Labs (all labs ordered are listed, but only abnormal results are displayed) Labs Reviewed  CBC WITH DIFFERENTIAL/PLATELET  PROTIME-INR  FIBRINOGEN    EKG None  Radiology No results found.  Procedures Procedures    Medications Ordered in ED Medications - No data to display  ED Course/ Medical Decision Making/ A&P Clinical Course as of 09/09/21 2343  Thu Sep 09, 2021  1755 Poison control is recommending an 8-hour observation and 6-hour labs of CBC, PT/INR, fibrinogen.  Patient is already hesitant about whether she will want to stay that long.  Fortunately it seems at  the least at this early 2-hour mark that she is likely not going to need anntivenom [SG]    Clinical Course User Index [SG] Sherwood Gambler, MD                           Medical Decision Making Amount and/or Complexity of Data Reviewed Labs: ordered.    Details: Normal labs   Patient's wound has remained stable and no new swelling, bruising, or neuro complaints.  Strong  DP pulse.  This probably was a snake bite but I doubt significant envenomation.  At this point she has been observed in the ER for 8 hours after her original injury.  Stable for discharge.  Given return precautions.  She has declined pain medicine.        Final Clinical Impression(s) / ED Diagnoses Final diagnoses:  Snake bite, initial encounter    Rx / DC Orders ED Discharge Orders     None         Sherwood Gambler, MD 09/09/21 2344

## 2021-09-09 NOTE — ED Notes (Signed)
Pt ambulated to the bathroom unassisted.  

## 2021-09-09 NOTE — ED Notes (Addendum)
2nd Measurements   Thigh: 18.5" Calf: 15.25"  No groin pain at this time. Denies any complaints other than tenderness at bite mark No noticeable increased swelling around bite mark at this time.  Red and hot to the touch spot below left knee. Marked with pen

## 2021-09-09 NOTE — ED Notes (Signed)
Third Measurement:  Thigh: 18.5" Calf: 15.5"  No Groin pain  Previous red/warm area on shin has dissipated.   Pt has no complaints at this time

## 2021-09-09 NOTE — ED Notes (Signed)
Lab at bedside

## 2021-09-09 NOTE — ED Notes (Signed)
Per EDP poison control will need to be contacted.

## 2021-09-09 NOTE — ED Notes (Signed)
Measurements at 1715  Thigh: 18" Calf: 15.5" Ankle: 9" Foot: 9"  Bite marks are 1.1" apart with bruising around. Site and swelling marked  Will continue to measure the next 3 hours per poison control recommendations

## 2021-09-09 NOTE — ED Triage Notes (Addendum)
Patient arrived via EMS with complaints of snake bite @ 1510 today. Unknown snake. Bite marks noted to left posterior thigh Bleeding controlled. Per fire department the bite resembles a venomous snake.

## 2021-09-16 DIAGNOSIS — D649 Anemia, unspecified: Secondary | ICD-10-CM | POA: Diagnosis not present

## 2021-09-16 DIAGNOSIS — D529 Folate deficiency anemia, unspecified: Secondary | ICD-10-CM | POA: Diagnosis not present

## 2021-09-16 DIAGNOSIS — D518 Other vitamin B12 deficiency anemias: Secondary | ICD-10-CM | POA: Diagnosis not present

## 2021-09-27 DIAGNOSIS — M797 Fibromyalgia: Secondary | ICD-10-CM | POA: Diagnosis not present

## 2021-09-27 DIAGNOSIS — N183 Chronic kidney disease, stage 3 unspecified: Secondary | ICD-10-CM | POA: Diagnosis not present

## 2021-09-27 DIAGNOSIS — E7849 Other hyperlipidemia: Secondary | ICD-10-CM | POA: Diagnosis not present

## 2021-09-27 DIAGNOSIS — F900 Attention-deficit hyperactivity disorder, predominantly inattentive type: Secondary | ICD-10-CM | POA: Diagnosis not present

## 2021-10-12 DIAGNOSIS — M79672 Pain in left foot: Secondary | ICD-10-CM | POA: Diagnosis not present

## 2021-10-12 DIAGNOSIS — Z9889 Other specified postprocedural states: Secondary | ICD-10-CM | POA: Diagnosis not present

## 2021-10-12 DIAGNOSIS — M2042 Other hammer toe(s) (acquired), left foot: Secondary | ICD-10-CM | POA: Diagnosis not present

## 2021-10-14 DIAGNOSIS — G43009 Migraine without aura, not intractable, without status migrainosus: Secondary | ICD-10-CM | POA: Diagnosis not present

## 2021-10-14 DIAGNOSIS — Z1212 Encounter for screening for malignant neoplasm of rectum: Secondary | ICD-10-CM | POA: Diagnosis not present

## 2021-10-14 DIAGNOSIS — E538 Deficiency of other specified B group vitamins: Secondary | ICD-10-CM | POA: Diagnosis not present

## 2021-10-14 DIAGNOSIS — G35 Multiple sclerosis: Secondary | ICD-10-CM | POA: Diagnosis not present

## 2021-10-14 DIAGNOSIS — D638 Anemia in other chronic diseases classified elsewhere: Secondary | ICD-10-CM | POA: Diagnosis not present

## 2021-10-14 DIAGNOSIS — R4582 Worries: Secondary | ICD-10-CM | POA: Diagnosis not present

## 2021-10-14 DIAGNOSIS — E7849 Other hyperlipidemia: Secondary | ICD-10-CM | POA: Diagnosis not present

## 2021-10-14 DIAGNOSIS — N1831 Chronic kidney disease, stage 3a: Secondary | ICD-10-CM | POA: Diagnosis not present

## 2021-10-14 DIAGNOSIS — E782 Mixed hyperlipidemia: Secondary | ICD-10-CM | POA: Diagnosis not present

## 2021-10-14 DIAGNOSIS — M797 Fibromyalgia: Secondary | ICD-10-CM | POA: Diagnosis not present

## 2021-10-14 DIAGNOSIS — Z9189 Other specified personal risk factors, not elsewhere classified: Secondary | ICD-10-CM | POA: Diagnosis not present

## 2021-10-14 DIAGNOSIS — F331 Major depressive disorder, recurrent, moderate: Secondary | ICD-10-CM | POA: Diagnosis not present

## 2021-10-14 DIAGNOSIS — I10 Essential (primary) hypertension: Secondary | ICD-10-CM | POA: Diagnosis not present

## 2021-10-14 DIAGNOSIS — R5383 Other fatigue: Secondary | ICD-10-CM | POA: Diagnosis not present

## 2021-10-14 DIAGNOSIS — I7300 Raynaud's syndrome without gangrene: Secondary | ICD-10-CM | POA: Diagnosis not present

## 2021-10-14 DIAGNOSIS — F900 Attention-deficit hyperactivity disorder, predominantly inattentive type: Secondary | ICD-10-CM | POA: Diagnosis not present

## 2021-10-14 DIAGNOSIS — Z0001 Encounter for general adult medical examination with abnormal findings: Secondary | ICD-10-CM | POA: Diagnosis not present

## 2021-10-28 DIAGNOSIS — E782 Mixed hyperlipidemia: Secondary | ICD-10-CM | POA: Diagnosis not present

## 2021-10-28 DIAGNOSIS — F331 Major depressive disorder, recurrent, moderate: Secondary | ICD-10-CM | POA: Diagnosis not present

## 2021-11-23 DIAGNOSIS — I890 Lymphedema, not elsewhere classified: Secondary | ICD-10-CM | POA: Diagnosis not present

## 2021-11-27 DIAGNOSIS — E782 Mixed hyperlipidemia: Secondary | ICD-10-CM | POA: Diagnosis not present

## 2021-11-27 DIAGNOSIS — F331 Major depressive disorder, recurrent, moderate: Secondary | ICD-10-CM | POA: Diagnosis not present

## 2022-01-26 DIAGNOSIS — D638 Anemia in other chronic diseases classified elsewhere: Secondary | ICD-10-CM | POA: Diagnosis not present

## 2022-01-26 DIAGNOSIS — M797 Fibromyalgia: Secondary | ICD-10-CM | POA: Diagnosis not present

## 2022-01-26 DIAGNOSIS — G43009 Migraine without aura, not intractable, without status migrainosus: Secondary | ICD-10-CM | POA: Diagnosis not present

## 2022-01-26 DIAGNOSIS — I10 Essential (primary) hypertension: Secondary | ICD-10-CM | POA: Diagnosis not present

## 2022-01-26 DIAGNOSIS — R4582 Worries: Secondary | ICD-10-CM | POA: Diagnosis not present

## 2022-01-26 DIAGNOSIS — I7300 Raynaud's syndrome without gangrene: Secondary | ICD-10-CM | POA: Diagnosis not present

## 2022-01-26 DIAGNOSIS — G35 Multiple sclerosis: Secondary | ICD-10-CM | POA: Diagnosis not present

## 2022-01-26 DIAGNOSIS — E538 Deficiency of other specified B group vitamins: Secondary | ICD-10-CM | POA: Diagnosis not present

## 2022-01-26 DIAGNOSIS — F900 Attention-deficit hyperactivity disorder, predominantly inattentive type: Secondary | ICD-10-CM | POA: Diagnosis not present

## 2022-01-26 DIAGNOSIS — R5383 Other fatigue: Secondary | ICD-10-CM | POA: Diagnosis not present

## 2022-01-26 DIAGNOSIS — N1831 Chronic kidney disease, stage 3a: Secondary | ICD-10-CM | POA: Diagnosis not present

## 2022-01-26 DIAGNOSIS — E7849 Other hyperlipidemia: Secondary | ICD-10-CM | POA: Diagnosis not present

## 2022-01-28 ENCOUNTER — Encounter: Payer: Self-pay | Admitting: Neurology

## 2022-02-17 DIAGNOSIS — S0012XA Contusion of left eyelid and periocular area, initial encounter: Secondary | ICD-10-CM | POA: Diagnosis not present

## 2022-04-11 DIAGNOSIS — M9905 Segmental and somatic dysfunction of pelvic region: Secondary | ICD-10-CM | POA: Diagnosis not present

## 2022-04-11 DIAGNOSIS — M62830 Muscle spasm of back: Secondary | ICD-10-CM | POA: Diagnosis not present

## 2022-04-11 DIAGNOSIS — M9902 Segmental and somatic dysfunction of thoracic region: Secondary | ICD-10-CM | POA: Diagnosis not present

## 2022-04-11 DIAGNOSIS — M9903 Segmental and somatic dysfunction of lumbar region: Secondary | ICD-10-CM | POA: Diagnosis not present

## 2022-04-11 DIAGNOSIS — M546 Pain in thoracic spine: Secondary | ICD-10-CM | POA: Diagnosis not present

## 2022-04-26 DIAGNOSIS — J329 Chronic sinusitis, unspecified: Secondary | ICD-10-CM | POA: Diagnosis not present

## 2022-05-23 DIAGNOSIS — M797 Fibromyalgia: Secondary | ICD-10-CM | POA: Diagnosis not present

## 2022-05-23 DIAGNOSIS — G35 Multiple sclerosis: Secondary | ICD-10-CM | POA: Diagnosis not present

## 2022-05-23 DIAGNOSIS — I10 Essential (primary) hypertension: Secondary | ICD-10-CM | POA: Diagnosis not present

## 2022-05-23 DIAGNOSIS — I890 Lymphedema, not elsewhere classified: Secondary | ICD-10-CM | POA: Diagnosis not present

## 2022-05-23 DIAGNOSIS — D638 Anemia in other chronic diseases classified elsewhere: Secondary | ICD-10-CM | POA: Diagnosis not present

## 2022-05-23 DIAGNOSIS — E7849 Other hyperlipidemia: Secondary | ICD-10-CM | POA: Diagnosis not present

## 2022-05-23 DIAGNOSIS — N1831 Chronic kidney disease, stage 3a: Secondary | ICD-10-CM | POA: Diagnosis not present

## 2022-05-23 DIAGNOSIS — I7300 Raynaud's syndrome without gangrene: Secondary | ICD-10-CM | POA: Diagnosis not present

## 2022-05-23 DIAGNOSIS — F900 Attention-deficit hyperactivity disorder, predominantly inattentive type: Secondary | ICD-10-CM | POA: Diagnosis not present

## 2022-05-23 DIAGNOSIS — R5383 Other fatigue: Secondary | ICD-10-CM | POA: Diagnosis not present

## 2022-05-23 DIAGNOSIS — R4582 Worries: Secondary | ICD-10-CM | POA: Diagnosis not present

## 2022-05-23 DIAGNOSIS — G43009 Migraine without aura, not intractable, without status migrainosus: Secondary | ICD-10-CM | POA: Diagnosis not present

## 2022-06-07 DIAGNOSIS — R300 Dysuria: Secondary | ICD-10-CM | POA: Diagnosis not present

## 2022-06-07 DIAGNOSIS — Z1231 Encounter for screening mammogram for malignant neoplasm of breast: Secondary | ICD-10-CM | POA: Diagnosis not present

## 2022-06-20 ENCOUNTER — Ambulatory Visit: Payer: HMO | Admitting: Neurology

## 2022-06-27 DIAGNOSIS — L814 Other melanin hyperpigmentation: Secondary | ICD-10-CM | POA: Diagnosis not present

## 2022-06-27 DIAGNOSIS — L821 Other seborrheic keratosis: Secondary | ICD-10-CM | POA: Diagnosis not present

## 2022-06-27 DIAGNOSIS — R202 Paresthesia of skin: Secondary | ICD-10-CM | POA: Diagnosis not present

## 2022-06-27 DIAGNOSIS — D1724 Benign lipomatous neoplasm of skin and subcutaneous tissue of left leg: Secondary | ICD-10-CM | POA: Diagnosis not present

## 2022-06-27 DIAGNOSIS — D225 Melanocytic nevi of trunk: Secondary | ICD-10-CM | POA: Diagnosis not present

## 2022-08-30 DIAGNOSIS — I7300 Raynaud's syndrome without gangrene: Secondary | ICD-10-CM | POA: Diagnosis not present

## 2022-08-30 DIAGNOSIS — D638 Anemia in other chronic diseases classified elsewhere: Secondary | ICD-10-CM | POA: Diagnosis not present

## 2022-08-30 DIAGNOSIS — I890 Lymphedema, not elsewhere classified: Secondary | ICD-10-CM | POA: Diagnosis not present

## 2022-08-30 DIAGNOSIS — R4582 Worries: Secondary | ICD-10-CM | POA: Diagnosis not present

## 2022-08-30 DIAGNOSIS — M797 Fibromyalgia: Secondary | ICD-10-CM | POA: Diagnosis not present

## 2022-08-30 DIAGNOSIS — N1831 Chronic kidney disease, stage 3a: Secondary | ICD-10-CM | POA: Diagnosis not present

## 2022-08-30 DIAGNOSIS — F900 Attention-deficit hyperactivity disorder, predominantly inattentive type: Secondary | ICD-10-CM | POA: Diagnosis not present

## 2022-08-30 DIAGNOSIS — I10 Essential (primary) hypertension: Secondary | ICD-10-CM | POA: Diagnosis not present

## 2022-08-30 DIAGNOSIS — G43009 Migraine without aura, not intractable, without status migrainosus: Secondary | ICD-10-CM | POA: Diagnosis not present

## 2022-08-30 DIAGNOSIS — E538 Deficiency of other specified B group vitamins: Secondary | ICD-10-CM | POA: Diagnosis not present

## 2022-08-30 DIAGNOSIS — E7849 Other hyperlipidemia: Secondary | ICD-10-CM | POA: Diagnosis not present

## 2022-08-30 DIAGNOSIS — R5383 Other fatigue: Secondary | ICD-10-CM | POA: Diagnosis not present

## 2022-11-01 DIAGNOSIS — I890 Lymphedema, not elsewhere classified: Secondary | ICD-10-CM | POA: Diagnosis not present

## 2022-11-01 DIAGNOSIS — R4582 Worries: Secondary | ICD-10-CM | POA: Diagnosis not present

## 2022-11-01 DIAGNOSIS — R300 Dysuria: Secondary | ICD-10-CM | POA: Diagnosis not present

## 2022-11-01 DIAGNOSIS — M85842 Other specified disorders of bone density and structure, left hand: Secondary | ICD-10-CM | POA: Diagnosis not present

## 2022-11-01 DIAGNOSIS — E782 Mixed hyperlipidemia: Secondary | ICD-10-CM | POA: Diagnosis not present

## 2022-11-01 DIAGNOSIS — E559 Vitamin D deficiency, unspecified: Secondary | ICD-10-CM | POA: Diagnosis not present

## 2022-11-01 DIAGNOSIS — E538 Deficiency of other specified B group vitamins: Secondary | ICD-10-CM | POA: Diagnosis not present

## 2022-11-01 DIAGNOSIS — D638 Anemia in other chronic diseases classified elsewhere: Secondary | ICD-10-CM | POA: Diagnosis not present

## 2022-11-01 DIAGNOSIS — Z0001 Encounter for general adult medical examination with abnormal findings: Secondary | ICD-10-CM | POA: Diagnosis not present

## 2022-11-01 DIAGNOSIS — M79642 Pain in left hand: Secondary | ICD-10-CM | POA: Diagnosis not present

## 2022-11-01 DIAGNOSIS — I10 Essential (primary) hypertension: Secondary | ICD-10-CM | POA: Diagnosis not present

## 2022-11-01 DIAGNOSIS — M797 Fibromyalgia: Secondary | ICD-10-CM | POA: Diagnosis not present

## 2022-11-01 DIAGNOSIS — E7849 Other hyperlipidemia: Secondary | ICD-10-CM | POA: Diagnosis not present

## 2022-11-01 DIAGNOSIS — Z1212 Encounter for screening for malignant neoplasm of rectum: Secondary | ICD-10-CM | POA: Diagnosis not present

## 2022-11-01 DIAGNOSIS — Z9189 Other specified personal risk factors, not elsewhere classified: Secondary | ICD-10-CM | POA: Diagnosis not present

## 2022-11-01 DIAGNOSIS — R5383 Other fatigue: Secondary | ICD-10-CM | POA: Diagnosis not present

## 2022-11-01 DIAGNOSIS — G43009 Migraine without aura, not intractable, without status migrainosus: Secondary | ICD-10-CM | POA: Diagnosis not present

## 2022-11-01 DIAGNOSIS — G35 Multiple sclerosis: Secondary | ICD-10-CM | POA: Diagnosis not present

## 2022-11-01 DIAGNOSIS — F331 Major depressive disorder, recurrent, moderate: Secondary | ICD-10-CM | POA: Diagnosis not present

## 2022-11-01 DIAGNOSIS — R220 Localized swelling, mass and lump, head: Secondary | ICD-10-CM | POA: Diagnosis not present

## 2022-11-01 DIAGNOSIS — N1831 Chronic kidney disease, stage 3a: Secondary | ICD-10-CM | POA: Diagnosis not present

## 2022-11-01 DIAGNOSIS — F900 Attention-deficit hyperactivity disorder, predominantly inattentive type: Secondary | ICD-10-CM | POA: Diagnosis not present

## 2022-11-01 DIAGNOSIS — M19042 Primary osteoarthritis, left hand: Secondary | ICD-10-CM | POA: Diagnosis not present

## 2022-11-01 DIAGNOSIS — I7300 Raynaud's syndrome without gangrene: Secondary | ICD-10-CM | POA: Diagnosis not present

## 2022-11-28 DIAGNOSIS — R32 Unspecified urinary incontinence: Secondary | ICD-10-CM | POA: Diagnosis not present

## 2022-11-28 DIAGNOSIS — R739 Hyperglycemia, unspecified: Secondary | ICD-10-CM | POA: Diagnosis not present

## 2022-11-28 DIAGNOSIS — F909 Attention-deficit hyperactivity disorder, unspecified type: Secondary | ICD-10-CM | POA: Diagnosis not present

## 2022-11-28 DIAGNOSIS — R600 Localized edema: Secondary | ICD-10-CM | POA: Diagnosis not present

## 2022-11-28 DIAGNOSIS — G43909 Migraine, unspecified, not intractable, without status migrainosus: Secondary | ICD-10-CM | POA: Diagnosis not present

## 2022-11-28 DIAGNOSIS — I10 Essential (primary) hypertension: Secondary | ICD-10-CM | POA: Diagnosis not present

## 2022-11-28 DIAGNOSIS — F419 Anxiety disorder, unspecified: Secondary | ICD-10-CM | POA: Diagnosis not present

## 2022-11-28 DIAGNOSIS — F331 Major depressive disorder, recurrent, moderate: Secondary | ICD-10-CM | POA: Diagnosis not present

## 2022-11-28 DIAGNOSIS — G35 Multiple sclerosis: Secondary | ICD-10-CM | POA: Diagnosis not present

## 2022-11-28 DIAGNOSIS — E782 Mixed hyperlipidemia: Secondary | ICD-10-CM | POA: Diagnosis not present

## 2022-11-28 DIAGNOSIS — N1831 Chronic kidney disease, stage 3a: Secondary | ICD-10-CM | POA: Diagnosis not present

## 2022-11-28 DIAGNOSIS — J301 Allergic rhinitis due to pollen: Secondary | ICD-10-CM | POA: Diagnosis not present

## 2022-12-19 DIAGNOSIS — M9903 Segmental and somatic dysfunction of lumbar region: Secondary | ICD-10-CM | POA: Diagnosis not present

## 2022-12-19 DIAGNOSIS — M546 Pain in thoracic spine: Secondary | ICD-10-CM | POA: Diagnosis not present

## 2022-12-19 DIAGNOSIS — M9902 Segmental and somatic dysfunction of thoracic region: Secondary | ICD-10-CM | POA: Diagnosis not present

## 2022-12-19 DIAGNOSIS — M9905 Segmental and somatic dysfunction of pelvic region: Secondary | ICD-10-CM | POA: Diagnosis not present

## 2022-12-19 DIAGNOSIS — M62830 Muscle spasm of back: Secondary | ICD-10-CM | POA: Diagnosis not present

## 2022-12-30 DIAGNOSIS — M62830 Muscle spasm of back: Secondary | ICD-10-CM | POA: Diagnosis not present

## 2022-12-30 DIAGNOSIS — M9903 Segmental and somatic dysfunction of lumbar region: Secondary | ICD-10-CM | POA: Diagnosis not present

## 2022-12-30 DIAGNOSIS — M9905 Segmental and somatic dysfunction of pelvic region: Secondary | ICD-10-CM | POA: Diagnosis not present

## 2022-12-30 DIAGNOSIS — M546 Pain in thoracic spine: Secondary | ICD-10-CM | POA: Diagnosis not present

## 2022-12-30 DIAGNOSIS — M9902 Segmental and somatic dysfunction of thoracic region: Secondary | ICD-10-CM | POA: Diagnosis not present

## 2023-01-11 DIAGNOSIS — M62830 Muscle spasm of back: Secondary | ICD-10-CM | POA: Diagnosis not present

## 2023-01-11 DIAGNOSIS — M546 Pain in thoracic spine: Secondary | ICD-10-CM | POA: Diagnosis not present

## 2023-01-11 DIAGNOSIS — M9905 Segmental and somatic dysfunction of pelvic region: Secondary | ICD-10-CM | POA: Diagnosis not present

## 2023-01-11 DIAGNOSIS — M9902 Segmental and somatic dysfunction of thoracic region: Secondary | ICD-10-CM | POA: Diagnosis not present

## 2023-01-11 DIAGNOSIS — M9903 Segmental and somatic dysfunction of lumbar region: Secondary | ICD-10-CM | POA: Diagnosis not present

## 2023-01-18 DIAGNOSIS — Z961 Presence of intraocular lens: Secondary | ICD-10-CM | POA: Diagnosis not present

## 2023-01-18 DIAGNOSIS — Z0100 Encounter for examination of eyes and vision without abnormal findings: Secondary | ICD-10-CM | POA: Diagnosis not present

## 2023-01-18 DIAGNOSIS — H02403 Unspecified ptosis of bilateral eyelids: Secondary | ICD-10-CM | POA: Diagnosis not present

## 2023-01-18 DIAGNOSIS — H02831 Dermatochalasis of right upper eyelid: Secondary | ICD-10-CM | POA: Diagnosis not present

## 2023-02-17 DIAGNOSIS — M79671 Pain in right foot: Secondary | ICD-10-CM | POA: Diagnosis not present

## 2023-02-24 DIAGNOSIS — M9905 Segmental and somatic dysfunction of pelvic region: Secondary | ICD-10-CM | POA: Diagnosis not present

## 2023-02-24 DIAGNOSIS — M9903 Segmental and somatic dysfunction of lumbar region: Secondary | ICD-10-CM | POA: Diagnosis not present

## 2023-02-24 DIAGNOSIS — M62830 Muscle spasm of back: Secondary | ICD-10-CM | POA: Diagnosis not present

## 2023-02-24 DIAGNOSIS — M546 Pain in thoracic spine: Secondary | ICD-10-CM | POA: Diagnosis not present

## 2023-02-24 DIAGNOSIS — M9902 Segmental and somatic dysfunction of thoracic region: Secondary | ICD-10-CM | POA: Diagnosis not present

## 2023-03-31 DIAGNOSIS — R600 Localized edema: Secondary | ICD-10-CM | POA: Diagnosis not present

## 2023-03-31 DIAGNOSIS — R251 Tremor, unspecified: Secondary | ICD-10-CM | POA: Diagnosis not present

## 2023-03-31 DIAGNOSIS — F331 Major depressive disorder, recurrent, moderate: Secondary | ICD-10-CM | POA: Diagnosis not present

## 2023-04-07 DIAGNOSIS — B0230 Zoster ocular disease, unspecified: Secondary | ICD-10-CM | POA: Diagnosis not present

## 2023-04-17 DIAGNOSIS — M810 Age-related osteoporosis without current pathological fracture: Secondary | ICD-10-CM | POA: Diagnosis not present

## 2023-04-17 DIAGNOSIS — Z780 Asymptomatic menopausal state: Secondary | ICD-10-CM | POA: Diagnosis not present

## 2023-04-28 DIAGNOSIS — R251 Tremor, unspecified: Secondary | ICD-10-CM | POA: Diagnosis not present

## 2023-04-28 DIAGNOSIS — R600 Localized edema: Secondary | ICD-10-CM | POA: Diagnosis not present

## 2023-04-28 DIAGNOSIS — F331 Major depressive disorder, recurrent, moderate: Secondary | ICD-10-CM | POA: Diagnosis not present

## 2023-05-09 DIAGNOSIS — H02831 Dermatochalasis of right upper eyelid: Secondary | ICD-10-CM | POA: Diagnosis not present

## 2023-05-29 DIAGNOSIS — M9903 Segmental and somatic dysfunction of lumbar region: Secondary | ICD-10-CM | POA: Diagnosis not present

## 2023-05-29 DIAGNOSIS — F331 Major depressive disorder, recurrent, moderate: Secondary | ICD-10-CM | POA: Diagnosis not present

## 2023-05-29 DIAGNOSIS — R251 Tremor, unspecified: Secondary | ICD-10-CM | POA: Diagnosis not present

## 2023-05-29 DIAGNOSIS — M25552 Pain in left hip: Secondary | ICD-10-CM | POA: Diagnosis not present

## 2023-05-29 DIAGNOSIS — M62830 Muscle spasm of back: Secondary | ICD-10-CM | POA: Diagnosis not present

## 2023-05-29 DIAGNOSIS — R600 Localized edema: Secondary | ICD-10-CM | POA: Diagnosis not present

## 2023-05-29 DIAGNOSIS — M9902 Segmental and somatic dysfunction of thoracic region: Secondary | ICD-10-CM | POA: Diagnosis not present

## 2023-05-29 DIAGNOSIS — M9905 Segmental and somatic dysfunction of pelvic region: Secondary | ICD-10-CM | POA: Diagnosis not present

## 2023-06-21 DIAGNOSIS — M9902 Segmental and somatic dysfunction of thoracic region: Secondary | ICD-10-CM | POA: Diagnosis not present

## 2023-06-21 DIAGNOSIS — M25552 Pain in left hip: Secondary | ICD-10-CM | POA: Diagnosis not present

## 2023-06-21 DIAGNOSIS — M9905 Segmental and somatic dysfunction of pelvic region: Secondary | ICD-10-CM | POA: Diagnosis not present

## 2023-06-21 DIAGNOSIS — M62830 Muscle spasm of back: Secondary | ICD-10-CM | POA: Diagnosis not present

## 2023-06-21 DIAGNOSIS — M9903 Segmental and somatic dysfunction of lumbar region: Secondary | ICD-10-CM | POA: Diagnosis not present

## 2023-06-21 DIAGNOSIS — Z1231 Encounter for screening mammogram for malignant neoplasm of breast: Secondary | ICD-10-CM | POA: Diagnosis not present

## 2023-06-28 DIAGNOSIS — F331 Major depressive disorder, recurrent, moderate: Secondary | ICD-10-CM | POA: Diagnosis not present

## 2023-06-28 DIAGNOSIS — R600 Localized edema: Secondary | ICD-10-CM | POA: Diagnosis not present

## 2023-06-28 DIAGNOSIS — R251 Tremor, unspecified: Secondary | ICD-10-CM | POA: Diagnosis not present

## 2023-07-28 DIAGNOSIS — R600 Localized edema: Secondary | ICD-10-CM | POA: Diagnosis not present

## 2023-07-28 DIAGNOSIS — R251 Tremor, unspecified: Secondary | ICD-10-CM | POA: Diagnosis not present

## 2023-07-28 DIAGNOSIS — F331 Major depressive disorder, recurrent, moderate: Secondary | ICD-10-CM | POA: Diagnosis not present

## 2023-08-25 DIAGNOSIS — M9903 Segmental and somatic dysfunction of lumbar region: Secondary | ICD-10-CM | POA: Diagnosis not present

## 2023-08-25 DIAGNOSIS — M9905 Segmental and somatic dysfunction of pelvic region: Secondary | ICD-10-CM | POA: Diagnosis not present

## 2023-08-25 DIAGNOSIS — M62830 Muscle spasm of back: Secondary | ICD-10-CM | POA: Diagnosis not present

## 2023-08-25 DIAGNOSIS — M9902 Segmental and somatic dysfunction of thoracic region: Secondary | ICD-10-CM | POA: Diagnosis not present

## 2023-08-28 DIAGNOSIS — R251 Tremor, unspecified: Secondary | ICD-10-CM | POA: Diagnosis not present

## 2023-08-28 DIAGNOSIS — R600 Localized edema: Secondary | ICD-10-CM | POA: Diagnosis not present

## 2023-08-28 DIAGNOSIS — F331 Major depressive disorder, recurrent, moderate: Secondary | ICD-10-CM | POA: Diagnosis not present

## 2023-09-18 DIAGNOSIS — M62830 Muscle spasm of back: Secondary | ICD-10-CM | POA: Diagnosis not present

## 2023-09-18 DIAGNOSIS — M9902 Segmental and somatic dysfunction of thoracic region: Secondary | ICD-10-CM | POA: Diagnosis not present

## 2023-09-18 DIAGNOSIS — M9903 Segmental and somatic dysfunction of lumbar region: Secondary | ICD-10-CM | POA: Diagnosis not present

## 2023-09-18 DIAGNOSIS — M9905 Segmental and somatic dysfunction of pelvic region: Secondary | ICD-10-CM | POA: Diagnosis not present

## 2023-09-28 DIAGNOSIS — R251 Tremor, unspecified: Secondary | ICD-10-CM | POA: Diagnosis not present

## 2023-09-28 DIAGNOSIS — R600 Localized edema: Secondary | ICD-10-CM | POA: Diagnosis not present

## 2023-09-28 DIAGNOSIS — F331 Major depressive disorder, recurrent, moderate: Secondary | ICD-10-CM | POA: Diagnosis not present

## 2023-09-29 DIAGNOSIS — Z6825 Body mass index (BMI) 25.0-25.9, adult: Secondary | ICD-10-CM | POA: Diagnosis not present

## 2023-09-29 DIAGNOSIS — E7849 Other hyperlipidemia: Secondary | ICD-10-CM | POA: Diagnosis not present

## 2023-09-29 DIAGNOSIS — Z1321 Encounter for screening for nutritional disorder: Secondary | ICD-10-CM | POA: Diagnosis not present

## 2023-09-29 DIAGNOSIS — F331 Major depressive disorder, recurrent, moderate: Secondary | ICD-10-CM | POA: Diagnosis not present

## 2023-09-29 DIAGNOSIS — I10 Essential (primary) hypertension: Secondary | ICD-10-CM | POA: Diagnosis not present

## 2023-09-29 DIAGNOSIS — M810 Age-related osteoporosis without current pathological fracture: Secondary | ICD-10-CM | POA: Diagnosis not present

## 2023-09-29 DIAGNOSIS — M5416 Radiculopathy, lumbar region: Secondary | ICD-10-CM | POA: Diagnosis not present

## 2023-09-29 DIAGNOSIS — E782 Mixed hyperlipidemia: Secondary | ICD-10-CM | POA: Diagnosis not present

## 2023-09-29 DIAGNOSIS — Z1329 Encounter for screening for other suspected endocrine disorder: Secondary | ICD-10-CM | POA: Diagnosis not present

## 2023-10-25 DIAGNOSIS — M5416 Radiculopathy, lumbar region: Secondary | ICD-10-CM | POA: Diagnosis not present

## 2023-10-27 DIAGNOSIS — R600 Localized edema: Secondary | ICD-10-CM | POA: Diagnosis not present

## 2023-10-27 DIAGNOSIS — R251 Tremor, unspecified: Secondary | ICD-10-CM | POA: Diagnosis not present

## 2023-10-27 DIAGNOSIS — F331 Major depressive disorder, recurrent, moderate: Secondary | ICD-10-CM | POA: Diagnosis not present

## 2023-11-08 DIAGNOSIS — M810 Age-related osteoporosis without current pathological fracture: Secondary | ICD-10-CM | POA: Diagnosis not present

## 2023-11-08 DIAGNOSIS — E782 Mixed hyperlipidemia: Secondary | ICD-10-CM | POA: Diagnosis not present

## 2023-11-08 DIAGNOSIS — D519 Vitamin B12 deficiency anemia, unspecified: Secondary | ICD-10-CM | POA: Diagnosis not present

## 2023-11-08 DIAGNOSIS — Z0001 Encounter for general adult medical examination with abnormal findings: Secondary | ICD-10-CM | POA: Diagnosis not present

## 2023-11-08 DIAGNOSIS — G43909 Migraine, unspecified, not intractable, without status migrainosus: Secondary | ICD-10-CM | POA: Diagnosis not present

## 2023-11-08 DIAGNOSIS — E7849 Other hyperlipidemia: Secondary | ICD-10-CM | POA: Diagnosis not present

## 2023-11-08 DIAGNOSIS — F331 Major depressive disorder, recurrent, moderate: Secondary | ICD-10-CM | POA: Diagnosis not present

## 2023-11-08 DIAGNOSIS — Z6827 Body mass index (BMI) 27.0-27.9, adult: Secondary | ICD-10-CM | POA: Diagnosis not present

## 2023-11-09 DIAGNOSIS — H02403 Unspecified ptosis of bilateral eyelids: Secondary | ICD-10-CM | POA: Diagnosis not present

## 2023-11-09 DIAGNOSIS — H02831 Dermatochalasis of right upper eyelid: Secondary | ICD-10-CM | POA: Diagnosis not present

## 2023-11-09 DIAGNOSIS — H57813 Brow ptosis, bilateral: Secondary | ICD-10-CM | POA: Diagnosis not present

## 2023-11-28 DIAGNOSIS — R251 Tremor, unspecified: Secondary | ICD-10-CM | POA: Diagnosis not present

## 2023-11-28 DIAGNOSIS — F331 Major depressive disorder, recurrent, moderate: Secondary | ICD-10-CM | POA: Diagnosis not present

## 2023-11-28 DIAGNOSIS — R600 Localized edema: Secondary | ICD-10-CM | POA: Diagnosis not present

## 2023-12-27 DIAGNOSIS — G43909 Migraine, unspecified, not intractable, without status migrainosus: Secondary | ICD-10-CM | POA: Diagnosis not present

## 2023-12-27 DIAGNOSIS — F419 Anxiety disorder, unspecified: Secondary | ICD-10-CM | POA: Diagnosis not present

## 2023-12-27 DIAGNOSIS — Z880 Allergy status to penicillin: Secondary | ICD-10-CM | POA: Diagnosis not present

## 2023-12-27 DIAGNOSIS — G8929 Other chronic pain: Secondary | ICD-10-CM | POA: Diagnosis not present

## 2023-12-27 DIAGNOSIS — N189 Chronic kidney disease, unspecified: Secondary | ICD-10-CM | POA: Diagnosis not present

## 2023-12-27 DIAGNOSIS — Z887 Allergy status to serum and vaccine status: Secondary | ICD-10-CM | POA: Diagnosis not present

## 2023-12-27 DIAGNOSIS — H57813 Brow ptosis, bilateral: Secondary | ICD-10-CM | POA: Diagnosis not present

## 2023-12-27 DIAGNOSIS — Z888 Allergy status to other drugs, medicaments and biological substances status: Secondary | ICD-10-CM | POA: Diagnosis not present

## 2023-12-27 DIAGNOSIS — H02403 Unspecified ptosis of bilateral eyelids: Secondary | ICD-10-CM | POA: Diagnosis not present

## 2023-12-27 DIAGNOSIS — Z91013 Allergy to seafood: Secondary | ICD-10-CM | POA: Diagnosis not present

## 2023-12-27 DIAGNOSIS — H02834 Dermatochalasis of left upper eyelid: Secondary | ICD-10-CM | POA: Diagnosis not present

## 2023-12-27 DIAGNOSIS — F32A Depression, unspecified: Secondary | ICD-10-CM | POA: Diagnosis not present

## 2023-12-27 DIAGNOSIS — H02831 Dermatochalasis of right upper eyelid: Secondary | ICD-10-CM | POA: Diagnosis not present

## 2023-12-29 DIAGNOSIS — F331 Major depressive disorder, recurrent, moderate: Secondary | ICD-10-CM | POA: Diagnosis not present

## 2023-12-29 DIAGNOSIS — R600 Localized edema: Secondary | ICD-10-CM | POA: Diagnosis not present

## 2023-12-29 DIAGNOSIS — R251 Tremor, unspecified: Secondary | ICD-10-CM | POA: Diagnosis not present

## 2024-01-22 DIAGNOSIS — M546 Pain in thoracic spine: Secondary | ICD-10-CM | POA: Diagnosis not present

## 2024-01-22 DIAGNOSIS — M9902 Segmental and somatic dysfunction of thoracic region: Secondary | ICD-10-CM | POA: Diagnosis not present

## 2024-01-22 DIAGNOSIS — M9905 Segmental and somatic dysfunction of pelvic region: Secondary | ICD-10-CM | POA: Diagnosis not present

## 2024-01-22 DIAGNOSIS — M62830 Muscle spasm of back: Secondary | ICD-10-CM | POA: Diagnosis not present

## 2024-01-22 DIAGNOSIS — M9903 Segmental and somatic dysfunction of lumbar region: Secondary | ICD-10-CM | POA: Diagnosis not present

## 2024-01-26 DIAGNOSIS — R600 Localized edema: Secondary | ICD-10-CM | POA: Diagnosis not present

## 2024-01-26 DIAGNOSIS — R251 Tremor, unspecified: Secondary | ICD-10-CM | POA: Diagnosis not present

## 2024-01-26 DIAGNOSIS — F331 Major depressive disorder, recurrent, moderate: Secondary | ICD-10-CM | POA: Diagnosis not present

## 2024-03-08 ENCOUNTER — Telehealth: Payer: Self-pay

## 2024-03-08 DIAGNOSIS — G35D Multiple sclerosis, unspecified: Secondary | ICD-10-CM

## 2024-03-11 ENCOUNTER — Telehealth: Payer: Self-pay

## 2024-03-11 NOTE — Progress Notes (Unsigned)
 Complex Care Management Note Care Guide Note  03/11/2024 Name: Nancy Byrd MRN: [id number] DOB: 11/01/1965   Complex Care Management Outreach Attempts: An unsuccessful telephone outreach was attempted today to offer the patient information about available complex care management services.  Follow Up Plan:  Additional outreach attempts will be made to offer the patient complex care management information and services.   Encounter Outcome:  No Answer-Left voicemail  Leotis Rase [facility], [facility] Guide  Direct Dial: [phone]  Fax [phone]

## 2024-03-12 NOTE — Progress Notes (Signed)
 Complex Care Management Note  Care Guide Note 03/12/2024 Name: Nancy Byrd MRN: [id number] DOB: 1965/05/11  Nancy Byrd is a 59 y.o. year old female who sees Patient, No Pcp Per for primary care. I reached out to Verneita KATHEE Comings by phone today to offer complex care management services.  Nancy Byrd was given information about Complex Care Management services today including:   The Complex Care Management services include support from the care team which includes your Nurse Care Manager, Clinical Social Worker, or Pharmacist.  The Complex Care Management team is here to help remove barriers to the health concerns and goals most important to you. Complex Care Management services are voluntary, and the patient may decline or stop services at any time by request to their care team member.   Complex Care Management Consent Status: Patient agreed to services and verbal consent obtained.   Follow up plan:  Telephone appointment with complex care management team member scheduled for:  03/15/24 and 03/19/24  Encounter Outcome:  Patient Scheduled  Leotis Rase [facility], [facility] Guide  Direct Dial: [phone]  Fax [phone]

## 2024-03-15 ENCOUNTER — Other Ambulatory Visit: Payer: Self-pay

## 2024-03-15 NOTE — Patient Instructions (Signed)
 Visit Information  Thank you for taking time to visit with me today. Please don't hesitate to contact me if I can be of assistance to you before our next scheduled appointment.  Our next appointment is by telephone on 1/30 at 11am Please call the care guide team at [phone] if you need to cancel or reschedule your appointment.   Following is a copy of your care plan:   Goals Addressed             This Visit's Progress    BSW Goals       Current SDOH Barriers:  Transportation Housing barriers Physicist, Medical strain  Interventions: Patient interviewed and appropriate screenings performed Referred patient to community resources  Discussed plans with patient for ongoing follow up and provided patient with direct contact number          Please call the Suicide and Crisis Lifeline: 988 call the [place]  National Suicide Prevention Lifeline: (908)355-8993 or TTY: 872-506-8096 TTY (678) 500-3166) to talk to a trained counselor call 1-800-273-TALK (toll free, 24 hour hotline) call the [facility]: 364-564-7154 call 911 if you are experiencing a Mental Health or Behavioral Health Crisis or need someone to talk to.  Patient verbalized understanding of Care plan and visit instructions communicated this visit  Orlean Fey, BSW [facility]  [facility] Social Worker, [employer] [facility] [phone]

## 2024-03-15 NOTE — Patient Outreach (Signed)
 Social Drivers of Health  Community Resource and Care Coordination Visit Note   03/15/2024  Name: Nancy Byrd MRN: [id number] DOB:1965-11-06  Situation: Referral received for [facility] needs assessment and assistance related to Housing  Financial Strain  Personal care services, Utilities. I obtained verbal consent from Patient.  Visit completed with Patient on the phone.   Background:   SDOH Interventions Today    Flowsheet Row Most Recent Value  SDOH Interventions   Housing Interventions Community Resources Provided  Transportation Interventions Community Resources Provided  Utilities Interventions Community Resources Provided  Financial Strain Interventions Community Resources Provided     Assessment:   BSW completed outreach to patient to assess for SDOH barriers. During the call, BSW identified housing, utilities, transportation, and financial resource strain as current barriers.Patient stated that she is currently living alone in her home following the passing of her husband. Patient reported that her husband had taken out a mortgage on the home, and she has been left responsible for the mortgage payments. Patient stated that while she continues to make payments, she is struggling to keep up financially and has difficulty catching up. Patient also reported that she is behind on her electric bill and is currently on a payment plan with [employer]. Patient stated that she received a notice indicating her electricity may be disconnected if payment is not made. Regarding transportation, patient stated that she does have access to a vehicle (truck). However, she reported that due to her health, she often becomes exhausted prior to scheduled appointments and is sometimes unable to drive herself to appointments as a result. Patient expressed interest in applying for Medicaid and stated that she believes she may need personal care services. Patient reported that she is unable to afford personal care  services out of pocket and does not currently have insurance coverage that would pay for these services. Patient stated that she has a family member who lives nearby; however, the family member is unable to provide consistent assistance due to work obligations. BSW will provide patient with resources related to housing assistance, utility assistance, Medicaid enrollment, and personal care services. BSW will follow up with the patient on January 30th at 11:00 AM.     Goals Addressed             This Visit's Progress    BSW Goals       Current SDOH Barriers:  Manufacturing Engineer strain  Interventions: Patient interviewed and appropriate screenings performed Referred patient to community resources  Discussed plans with patient for ongoing follow up and provided patient with direct contact number          Recommendation:   attend all scheduled provider appointments call for transportation assistance at least one week before appointments ask for help if you don't understand your health insurance benefits  Follow Up Plan:   Telephone follow-up 03/29/24 at 11am  Orlean Fey, BSW [place]  [facility] Institute Social Worker, [employer] [phone]

## 2024-03-19 ENCOUNTER — Encounter: Payer: Self-pay | Admitting: *Deleted

## 2024-03-19 ENCOUNTER — Telehealth: Payer: Self-pay | Admitting: *Deleted

## 2024-03-19 NOTE — Patient Instructions (Signed)
 Nancy Byrd, I am sorry I was unable to reach you today for our scheduled appointment. I work with [employer], Care Management Department and am calling to support your healthcare needs. I received your voicemail and attempted to call back, but was unable to reach you. I have rescheduled our telephone call for 03/21/24 at 3:30. I look forward to speaking with you soon.   Thank you,   Josette Pellet, RN, BSN [place]  [facility] RN Care Manager Direct Dial: [phone]  Fax: [phone]

## 2024-03-21 ENCOUNTER — Other Ambulatory Visit: Payer: Self-pay | Admitting: *Deleted

## 2024-03-21 ENCOUNTER — Encounter: Payer: Self-pay | Admitting: *Deleted

## 2024-03-21 ENCOUNTER — Other Ambulatory Visit: Payer: Self-pay

## 2024-03-29 ENCOUNTER — Telehealth: Payer: Self-pay

## 2024-03-29 ENCOUNTER — Other Ambulatory Visit: Payer: Self-pay

## 2024-03-29 NOTE — Patient Instructions (Signed)
 Verneita KATHEE Comings - I am sorry I was unable to reach you today for our scheduled appointment. I work with Toribio Jerel MATSU, MD and am calling to support your healthcare needs. Please contact me at [phone] at your earliest convenience. I look forward to speaking with you soon.   Thank you,  Orlean Fey, BSW   [facility] Social Worker, [employer] [phone]

## 2024-03-29 NOTE — Progress Notes (Unsigned)
 Complex Care Management Care Guide Note  03/29/2024 Name: NATAYLA CADENHEAD MRN: [id number] DOB: 1965-11-24  BARBARITA HUTMACHER is a 59 y.o. year old female who is a primary care patient of Toribio Jerel MATSU, MD and is actively engaged with the care management team. I reached out to Verneita KATHEE Comings by phone today to assist with re-scheduling  with the RN Case Manager.  Follow up plan: Unsuccessful telephone outreach attempt made. A HIPAA compliant phone message was left for the patient providing contact information and requesting a return call.  Jeoffrey Buffalo , RMA     [facility]  [facility], [facility] Guide  Direct Dial: [phone]  Website: [email or web address]

## 2024-04-03 ENCOUNTER — Telehealth: Payer: Self-pay | Admitting: *Deleted

## 2024-04-05 ENCOUNTER — Other Ambulatory Visit: Payer: Self-pay

## 2024-04-05 NOTE — Patient Instructions (Signed)
 Verneita KATHEE Comings - I am sorry I was unable to reach you today for our scheduled appointment. I work with Toribio Jerel MATSU, MD and am calling to support your healthcare needs. Please contact me at [phone] at your earliest convenience. I look forward to speaking with you soon.   Thank you,  Orlean Fey, BSW [place]  [facility] Institute Social Worker, [employer] [phone]

## 2024-04-11 ENCOUNTER — Telehealth: Payer: Self-pay
